# Patient Record
Sex: Male | Born: 1955
Health system: Southern US, Community
[De-identification: ages and names within clinical notes are randomized; demographics above are authoritative.]

## PROBLEM LIST (undated history)

## (undated) DIAGNOSIS — H269 Unspecified cataract: Secondary | ICD-10-CM

## (undated) DIAGNOSIS — N400 Enlarged prostate without lower urinary tract symptoms: Secondary | ICD-10-CM

## (undated) DIAGNOSIS — I10 Essential (primary) hypertension: Secondary | ICD-10-CM

## (undated) DIAGNOSIS — K219 Gastro-esophageal reflux disease without esophagitis: Secondary | ICD-10-CM

## (undated) DIAGNOSIS — F419 Anxiety disorder, unspecified: Secondary | ICD-10-CM

## (undated) HISTORY — DX: Anxiety disorder, unspecified: F41.9

## (undated) HISTORY — DX: Essential (primary) hypertension: I10

## (undated) HISTORY — DX: Benign prostatic hyperplasia without lower urinary tract symptoms: N40.0

## (undated) HISTORY — PX: EYE SURGERY: SHX253

## (undated) HISTORY — DX: Unspecified cataract: H26.9

## (undated) HISTORY — DX: Gastro-esophageal reflux disease without esophagitis: K21.9

## (undated) HISTORY — PX: DOBUTAMINE STRESS ECHO: SHX5426

---

## 2003-12-04 ENCOUNTER — Encounter: Admission: RE | Admit: 2003-12-04 | Discharge: 2003-12-04 | Payer: Self-pay | Admitting: Family Medicine

## 2005-11-03 ENCOUNTER — Ambulatory Visit: Payer: Self-pay | Admitting: Family Medicine

## 2006-03-01 ENCOUNTER — Ambulatory Visit: Payer: Self-pay | Admitting: Family Medicine

## 2006-03-31 ENCOUNTER — Ambulatory Visit: Payer: Self-pay | Admitting: Family Medicine

## 2006-04-22 ENCOUNTER — Ambulatory Visit: Payer: Self-pay | Admitting: Family Medicine

## 2006-04-22 ENCOUNTER — Encounter: Admission: RE | Admit: 2006-04-22 | Discharge: 2006-04-22 | Payer: Self-pay | Admitting: Family Medicine

## 2006-04-27 ENCOUNTER — Encounter: Admission: RE | Admit: 2006-04-27 | Discharge: 2006-04-27 | Payer: Self-pay | Admitting: Family Medicine

## 2007-01-11 ENCOUNTER — Ambulatory Visit: Payer: Self-pay | Admitting: Internal Medicine

## 2007-01-11 DIAGNOSIS — I10 Essential (primary) hypertension: Secondary | ICD-10-CM | POA: Insufficient documentation

## 2007-01-11 DIAGNOSIS — R002 Palpitations: Secondary | ICD-10-CM

## 2007-01-12 LAB — CONVERTED CEMR LAB
ALT: 34 units/L (ref 0–53)
AST: 25 units/L (ref 0–37)
Albumin: 4.3 g/dL (ref 3.5–5.2)
Alkaline Phosphatase: 60 units/L (ref 39–117)
BUN: 11 mg/dL (ref 6–23)
Basophils Absolute: 0 10*3/uL (ref 0.0–0.1)
Basophils Relative: 0.1 % (ref 0.0–1.0)
Bilirubin, Direct: 0.1 mg/dL (ref 0.0–0.3)
CO2: 32 meq/L (ref 19–32)
Calcium: 9.6 mg/dL (ref 8.4–10.5)
Chloride: 102 meq/L (ref 96–112)
Cholesterol: 187 mg/dL (ref 0–200)
Creatinine, Ser: 1 mg/dL (ref 0.4–1.5)
Eosinophils Absolute: 0.1 10*3/uL (ref 0.0–0.6)
Eosinophils Relative: 1.5 % (ref 0.0–5.0)
GFR calc Af Amer: 102 mL/min
GFR calc non Af Amer: 84 mL/min
Glucose, Bld: 103 mg/dL — ABNORMAL HIGH (ref 70–99)
HCT: 46.1 % (ref 39.0–52.0)
HDL: 51 mg/dL (ref >39.0)
Hemoglobin: 15.6 g/dL (ref 13.0–17.0)
LDL Cholesterol: 117 mg/dL — ABNORMAL HIGH (ref 0–99)
Lymphocytes Relative: 22.6 % (ref 12.0–46.0)
MCHC: 33.9 g/dL (ref 30.0–36.0)
MCV: 94.6 fL (ref 78.0–100.0)
Monocytes Absolute: 0.7 10*3/uL (ref 0.2–0.7)
Monocytes Relative: 8.8 % (ref 3.0–11.0)
Neutro Abs: 5.8 10*3/uL (ref 1.4–7.7)
Neutrophils Relative %: 67 % (ref 43.0–77.0)
Phosphorus: 3.6 mg/dL (ref 2.3–4.6)
Platelets: 239 10*3/uL (ref 150–400)
Potassium: 4.3 meq/L (ref 3.5–5.1)
RBC: 4.87 M/uL (ref 4.22–5.81)
RDW: 12.4 % (ref 11.5–14.6)
Sodium: 141 meq/L (ref 135–145)
TSH: 2.01 microintl units/mL (ref 0.35–5.50)
Total Bilirubin: 1.2 mg/dL (ref 0.3–1.2)
Total CHOL/HDL Ratio: 3.7
Total Protein: 7.5 g/dL (ref 6.0–8.3)
Triglycerides: 97 mg/dL (ref 0–149)
VLDL: 19 mg/dL (ref 0–40)
WBC: 8.5 10*3/uL (ref 4.5–10.5)

## 2007-01-26 HISTORY — PX: DOBUTAMINE STRESS ECHO: SHX5426

## 2007-02-02 ENCOUNTER — Encounter: Payer: Self-pay | Admitting: Internal Medicine

## 2007-02-02 ENCOUNTER — Ambulatory Visit: Payer: Self-pay

## 2007-03-16 ENCOUNTER — Ambulatory Visit: Payer: Self-pay | Admitting: Internal Medicine

## 2007-09-18 ENCOUNTER — Ambulatory Visit: Payer: Self-pay | Admitting: Internal Medicine

## 2007-10-20 ENCOUNTER — Ambulatory Visit: Payer: Self-pay | Admitting: Internal Medicine

## 2007-10-20 DIAGNOSIS — K589 Irritable bowel syndrome without diarrhea: Secondary | ICD-10-CM | POA: Insufficient documentation

## 2007-11-20 ENCOUNTER — Ambulatory Visit: Payer: Self-pay | Admitting: Internal Medicine

## 2007-11-22 LAB — CONVERTED CEMR LAB
Albumin: 4 g/dL (ref 3.5–5.2)
BUN: 13 mg/dL (ref 6–23)
Basophils Absolute: 0 10*3/uL (ref 0.0–0.1)
Basophils Relative: 0.5 % (ref 0.0–3.0)
CO2: 32 meq/L (ref 19–32)
Calcium: 9.2 mg/dL (ref 8.4–10.5)
Chloride: 102 meq/L (ref 96–112)
Cholesterol: 184 mg/dL (ref 0–200)
Creatinine, Ser: 1.1 mg/dL (ref 0.4–1.5)
Eosinophils Absolute: 0.2 10*3/uL (ref 0.0–0.7)
Eosinophils Relative: 1.9 % (ref 0.0–5.0)
GFR calc Af Amer: 91 mL/min
GFR calc non Af Amer: 75 mL/min
Glucose, Bld: 109 mg/dL — ABNORMAL HIGH (ref 70–99)
HCT: 44 % (ref 39.0–52.0)
HDL: 49.8 mg/dL (ref >39.0)
Hemoglobin: 15.4 g/dL (ref 13.0–17.0)
LDL Cholesterol: 114 mg/dL — ABNORMAL HIGH (ref 0–99)
Lymphocytes Relative: 25.1 % (ref 12.0–46.0)
MCHC: 35 g/dL (ref 30.0–36.0)
MCV: 93.8 fL (ref 78.0–100.0)
Monocytes Absolute: 0.7 10*3/uL (ref 0.1–1.0)
Monocytes Relative: 8.6 % (ref 3.0–12.0)
Neutro Abs: 5 10*3/uL (ref 1.4–7.7)
Neutrophils Relative %: 63.9 % (ref 43.0–77.0)
PSA: 0.89 ng/mL (ref 0.10–4.00)
Phosphorus: 3.1 mg/dL (ref 2.3–4.6)
Platelets: 205 10*3/uL (ref 150–400)
Potassium: 4.8 meq/L (ref 3.5–5.1)
RBC: 4.69 M/uL (ref 4.22–5.81)
RDW: 12.2 % (ref 11.5–14.6)
Sodium: 140 meq/L (ref 135–145)
TSH: 2.01 microintl units/mL (ref 0.35–5.50)
Total CHOL/HDL Ratio: 3.7
Triglycerides: 101 mg/dL (ref 0–149)
VLDL: 20 mg/dL (ref 0–40)
WBC: 7.8 10*3/uL (ref 4.5–10.5)

## 2008-01-10 ENCOUNTER — Encounter: Payer: Self-pay | Admitting: Internal Medicine

## 2008-02-01 ENCOUNTER — Telehealth: Payer: Self-pay | Admitting: Family Medicine

## 2008-03-01 ENCOUNTER — Encounter: Payer: Self-pay | Admitting: Internal Medicine

## 2008-03-01 ENCOUNTER — Ambulatory Visit: Payer: Self-pay | Admitting: Gastroenterology

## 2008-03-01 HISTORY — PX: COLONOSCOPY: SHX174

## 2008-03-01 LAB — HM COLONOSCOPY

## 2008-05-22 ENCOUNTER — Ambulatory Visit: Payer: Self-pay | Admitting: Internal Medicine

## 2008-05-22 DIAGNOSIS — K219 Gastro-esophageal reflux disease without esophagitis: Secondary | ICD-10-CM | POA: Insufficient documentation

## 2008-12-02 ENCOUNTER — Encounter: Payer: Self-pay | Admitting: Internal Medicine

## 2008-12-03 ENCOUNTER — Ambulatory Visit: Payer: Self-pay | Admitting: Internal Medicine

## 2008-12-03 DIAGNOSIS — G479 Sleep disorder, unspecified: Secondary | ICD-10-CM | POA: Insufficient documentation

## 2008-12-03 DIAGNOSIS — M79609 Pain in unspecified limb: Secondary | ICD-10-CM

## 2008-12-03 DIAGNOSIS — H9190 Unspecified hearing loss, unspecified ear: Secondary | ICD-10-CM | POA: Insufficient documentation

## 2008-12-06 LAB — CONVERTED CEMR LAB
ALT: 30 units/L (ref 0–53)
AST: 26 units/L (ref 0–37)
BUN: 11 mg/dL (ref 6–23)
Bilirubin, Direct: 0 mg/dL (ref 0.0–0.3)
Creatinine, Ser: 1.1 mg/dL (ref 0.4–1.5)
GFR calc non Af Amer: 74.97 mL/min (ref >60)
HCT: 46 % (ref 39.0–52.0)
Lymphocytes Relative: 28.8 % (ref 12.0–46.0)
Monocytes Relative: 8.2 % (ref 3.0–12.0)
Phosphorus: 3 mg/dL (ref 2.3–4.6)
Platelets: 188 10*3/uL (ref 150.0–400.0)
RDW: 12.5 % (ref 11.5–14.6)
Total Protein: 7.4 g/dL (ref 6.0–8.3)

## 2008-12-09 ENCOUNTER — Encounter: Payer: Self-pay | Admitting: Internal Medicine

## 2009-03-06 ENCOUNTER — Telehealth: Payer: Self-pay | Admitting: Internal Medicine

## 2009-10-06 ENCOUNTER — Encounter: Payer: Self-pay | Admitting: Internal Medicine

## 2009-10-07 ENCOUNTER — Ambulatory Visit: Payer: Self-pay | Admitting: Internal Medicine

## 2009-10-07 DIAGNOSIS — M549 Dorsalgia, unspecified: Secondary | ICD-10-CM | POA: Insufficient documentation

## 2009-10-07 DIAGNOSIS — R079 Chest pain, unspecified: Secondary | ICD-10-CM

## 2009-10-08 LAB — CONVERTED CEMR LAB
BUN: 15 mg/dL (ref 6–23)
Basophils Absolute: 0.1 10*3/uL (ref 0.0–0.1)
Bilirubin, Direct: 0.1 mg/dL (ref 0.0–0.3)
Calcium: 9.5 mg/dL (ref 8.4–10.5)
Creatinine, Ser: 1.1 mg/dL (ref 0.4–1.5)
Eosinophils Absolute: 0.1 10*3/uL (ref 0.0–0.7)
Eosinophils Relative: 1.9 % (ref 0.0–5.0)
Glucose, Bld: 101 mg/dL — ABNORMAL HIGH (ref 70–99)
Lymphs Abs: 2 10*3/uL (ref 0.7–4.0)
MCHC: 33.7 g/dL (ref 30.0–36.0)
MCV: 96.7 fL (ref 78.0–100.0)
Monocytes Absolute: 0.6 10*3/uL (ref 0.1–1.0)
Neutrophils Relative %: 56.8 % (ref 43.0–77.0)
PSA: 0.56 ng/mL (ref 0.10–4.00)
Platelets: 204 10*3/uL (ref 150.0–400.0)
RDW: 13.1 % (ref 11.5–14.6)
Sodium: 142 meq/L (ref 135–145)
Total Bilirubin: 0.7 mg/dL (ref 0.3–1.2)
WBC: 6.6 10*3/uL (ref 4.5–10.5)

## 2009-10-28 ENCOUNTER — Telehealth: Payer: Self-pay | Admitting: Internal Medicine

## 2009-11-28 ENCOUNTER — Telehealth: Payer: Self-pay | Admitting: Internal Medicine

## 2010-02-06 ENCOUNTER — Other Ambulatory Visit: Payer: Self-pay | Admitting: Internal Medicine

## 2010-02-06 ENCOUNTER — Ambulatory Visit
Admission: RE | Admit: 2010-02-06 | Discharge: 2010-02-06 | Payer: Self-pay | Source: Home / Self Care | Attending: Internal Medicine | Admitting: Internal Medicine

## 2010-02-06 ENCOUNTER — Encounter: Payer: Self-pay | Admitting: Internal Medicine

## 2010-02-06 DIAGNOSIS — F411 Generalized anxiety disorder: Secondary | ICD-10-CM | POA: Insufficient documentation

## 2010-02-06 LAB — POTASSIUM: Potassium: 5.2 mEq/L — ABNORMAL HIGH (ref 3.5–5.1)

## 2010-02-10 ENCOUNTER — Ambulatory Visit
Admission: RE | Admit: 2010-02-10 | Discharge: 2010-02-10 | Payer: Self-pay | Source: Home / Self Care | Attending: Psychology | Admitting: Psychology

## 2010-02-24 ENCOUNTER — Ambulatory Visit: Admit: 2010-02-24 | Payer: Self-pay | Admitting: Psychology

## 2010-02-24 NOTE — Progress Notes (Signed)
Summary: Rx Trazodone  Phone Note Refill Request Call back at [phone] Message from:  Scriptline on March 06, 2009 8:13 AM  Refills Requested: Medication #1:  TRAZODONE HCL 50 MG TABS 2 tab by mouth at bedtime   Last Refilled: 01/31/2009 Received e-script request, please advise   Method Requested: Telephone to Pharmacy Initial call taken by: Linde Gillis CMA [facility]ull),  March 06, 2009 8:15 AM  Follow-up for Phone Call        okay to refill x 1 year Follow-up by: Cindee Salt MD,  March 06, 2009 5:49 PM  Additional Follow-up for Phone Call Additional follow up Details #1::        Rx faxed to pharmacy Additional Follow-up by: DeShannon Smith CMA [facility]),  March 07, 2009 9:47 AM    Prescriptions: TRAZODONE HCL 50 MG TABS (TRAZODONE HCL) 2 tab by mouth at bedtime  #60 x 12   Entered by:   Mervin Hack CMA (AAMA)   Authorized by:   Cindee Salt MD   Signed by:   Mervin Hack CMA (AAMA) on 03/07/2009   Method used:   Electronically to        [facility] [address] (retail)       [address]       [place], [place]  [address]       Ph: [phone]       Fax: [phone]   RxID:   [id number]

## 2010-02-24 NOTE — Assessment & Plan Note (Signed)
Summary: LOWER BACK PAIN/RBH   Vital Signs:  Patient profile:   55 year old male Weight:      190 pounds BMI:     26.60 Temp:     98.4 degrees F oral Pulse rate:   60 / minute Pulse rhythm:   regular BP sitting:   110 / 60  (left arm) Cuff size:   large  Vitals Entered By: Mervin Hack CMA [facility] Dull) (October 07, 2009 9:52 AM) CC: BACK PAIN   History of Present Illness: Has several concerns  Noticed in winter time that when lifting and bringing thikngs close to his body, he will get a sharp pain down in the left groin and down medial leg. No bulging. Now only has occ tingling but mostly gone. he is careful to lift using his legs No leg weakness  Still gets occ pain in left chest woke him up last night No SOB No heartburn during the day or with the episode Has been more careful with his diet  GERD generally controlled with the omeprazole  Having back pain doesn't recall any injury Gets pain in upper left back if he works above his head occ pain in "kidney area" Gets "drawing" with deep breath Has pain with prolonged standing intermittent Has tried some stretches  Awakens fine---then may come on as day goes on Plays softball in evening and no problems with that Has tried some aleve---it eliminates the pain  feels well now  Allergies: No Known Drug Allergies  Past History:  Past medical, surgical, family and social histories (including risk factors) reviewed for relevance to current acute and chronic problems.  Past Medical History: Reviewed history from 05/22/2008 and no changes required. Hypertension Anxiety GERD  Past Surgical History: Stress echo negative 12/09  Family History: Reviewed history from 01/11/2007 and no changes required. Dad died of COPD, enlarged heart @65  Mom okay 1 brother with emphysema 1 sister with arthrits No CAD, DM, HTN No colon or prostate cancer  Social History: Reviewed history from 11/20/2007 and no changes  required. Occupation: Airline pilot for doors and hardware Divorced---4 children Former smoker--1990---occ chews Alcohol use-no  Review of Systems       no dysuria or hematuria weight down 12#--he is surprised by this but has been more careful with cutting out sugared drinks still doesn't eat regular meals  Physical Exam  General:  alert and normal appearance.   Neck:  supple, no masses, no thyromegaly, no carotid bruits, and no cervical lymphadenopathy.   Lungs:  normal respiratory effort, no intercostal retractions, no accessory muscle use, and normal breath sounds.   Heart:  normal rate, regular rhythm, no murmur, and no gallop.   Abdomen:  soft, non-tender, and no inguinal hernia.   Msk:  No back tenderness Normal ROM Neurologic:  strength normal in all extremities and gait normal.   Psych:  normally interactive, good eye contact, not anxious appearing, and not depressed appearing.     Impression & Recommendations:  Problem # 1:  CHEST PAIN (ICD-786.50) Assessment New  doesn't sound ischemic seems to have adequate GERD control will just observe will check labs due to concern about weight loss  Orders: Venipuncture (54098) TLB-Renal Function Panel (80069-RENAL) TLB-CBC Platelet - w/Differential (85025-CBCD) TLB-Hepatic/Liver Function Pnl (80076-HEPATIC) TLB-TSH (Thyroid Stimulating Hormone) (84443-TSH) EKG w/ Interpretation (93000)  Problem # 2:  BACK PAIN (ICD-724.5) Assessment: New lower thoracic clearly seems to be mechanical muscular pain can use NSAIDs needs to evaluate work mechanics--does have to hold things above  his head often  Complete Medication List: 1)  Trazodone Hcl 50 Mg Tabs (Trazodone hcl) .... 2 tab by mouth at bedtime 2)  Multivitamins Tabs (Multiple vitamin) .... Take 1 tablet by mouth once a day 3)  Omeprazole 20 Mg Cpdr (Omeprazole) .... Take 1 by mouth once daily  Other Orders: TLB-PSA (Prostate Specific Antigen) (95621-HYQ)  Patient  Instructions: 1)  Keep physical appt in January  Current Allergies (reviewed today): No known allergies    EKG  Procedure date:  10/07/2009  Findings:      Sinus bradycardia @ 49 Normal

## 2010-02-24 NOTE — Progress Notes (Signed)
Summary: needs trazodone script changed  Phone Note Refill Request Call back at Work Phone [phone] Message from:  Patient  Refills Requested: Medication #1:  TRAZODONE HCL 100 MG TABS 1 tab at bedtime to help sleep. Pt increased to 150 mg's at bedtime so he  needs increased amount.  He has refills but we need to call pharmacy and change instructions.  [facility] [address].  Initial call taken by: Lowella Petties CMA, AAMA,  November 28, 2009 9:41 AM  Follow-up for Phone Call        okay to change to 150mg  at bedtime send #30 x 11 Follow-up by: Cindee Salt MD,  November 28, 2009 2:17 PM  Additional Follow-up for Phone Call Additional follow up Details #1::        Rx send to pharmacy.  Message left on voicemail at patients job advising as instructed. Additional Follow-up by: Linde Gillis CMA [facility]ull),  November 28, 2009 2:31 PM    New/Updated Medications: TRAZODONE HCL 150 MG TABS (TRAZODONE HCL) take one tablet by mouth at bedtime Prescriptions: TRAZODONE HCL 150 MG TABS (TRAZODONE HCL) take one tablet by mouth at bedtime  #30 x 11   Entered by:   Linde Gillis CMA (AAMA)   Authorized by:   Cindee Salt MD   Signed by:   Linde Gillis CMA (AAMA) on 11/28/2009   Method used:   Electronically to        [facility] [address] (retail)       [address], [address]       [place], [place]  [address]       Ph: [phone]       Fax: [phone]   RxID:   [id number]   Current Allergies (reviewed today): No known allergies

## 2010-02-24 NOTE — Progress Notes (Signed)
Summary: regarding trazodone  Phone Note From Pharmacy   Caller: [facility] [address] Summary of Call: Pharmacy is asking to change trazadone to 100 mg's, one at bedtime.  50 mg tablets are on backorder.  Form is on your desk. Initial call taken by: Lowella Petties CMA,  October 28, 2009 11:42 AM  Follow-up for Phone Call        Prescription resent and changed to the 100mg  Please let the patient know about the change Follow-up by: Cindee Salt MD,  October 28, 2009 1:48 PM    New/Updated Medications: TRAZODONE HCL 100 MG TABS (TRAZODONE HCL) 1 tab at bedtime to help sleep Prescriptions: TRAZODONE HCL 100 MG TABS (TRAZODONE HCL) 1 tab at bedtime to help sleep  #30 x 11   Entered and Authorized by:   Cindee Salt MD   Signed by:   Cindee Salt MD on 10/28/2009   Method used:   Electronically to        [facility] [address] (retail)       [address], [address]       [place], [place]  [address]       Ph: [phone]       Fax: [phone]   RxID:   [id number]

## 2010-02-26 NOTE — Assessment & Plan Note (Signed)
Summary: CPX/RBH   Vital Signs:  Patient profile:   55 year old male Weight:      194 pounds Temp:     98.1 degrees F oral Pulse rate:   60 / minute Pulse rhythm:   regular BP sitting:   133 / 87  (left arm) Cuff size:   large  Vitals Entered By: Mervin Hack CMA (AAMA) (February 06, 2010 8:30 AM) CC: adult physical   History of Present Illness: Doing okay back is better  Ongoing occ sleep and anxiety issues Did go up on trazodone Sleeping okay No major blow-ups at work Girlfriend (finacee)  concerned about his behaviors still--would like him to see counsellor When she has trust issues, etc--this adds to his anxiety   Allergies: No Known Drug Allergies  Past History:  Past medical, surgical, family and social histories (including risk factors) reviewed for relevance to current acute and chronic problems.  Past Medical History: Reviewed history from 05/22/2008 and no changes required. Hypertension Anxiety GERD  Past Surgical History: Reviewed history from 10/07/2009 and no changes required. Stress echo negative 12/09  Family History: Reviewed history from 01/11/2007 and no changes required. Dad died of COPD, enlarged heart @65  Mom okay 1 brother with emphysema 1 sister with arthrits No CAD, DM, HTN No colon or prostate cancer  Social History: Reviewed history from 11/20/2007 and no changes required. 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 Eyes:  Denies double vision and vision loss-1 eye. ENT:  Complains of ringing in ears; denies decreased hearing; chronic tiinnitus Teeth okay--overdue for dentist. CV:  Complains of palpitations; denies chest pain or discomfort, difficulty breathing at night, difficulty breathing while lying down, fainting, lightheadness, and shortness of  breath with exertion; occ palps with nerves. Resp:  Denies cough and shortness of breath. GI:  Complains of indigestion; denies abdominal pain, bloody stools, change in bowel habits, dark tarry stools, nausea, and vomiting; occ heartburn--uses OTC prevacid as needed . GU:  Denies erectile dysfunction, urinary frequency, and urinary hesitancy. MS:  Complains of joint pain; denies joint swelling; right 2nd DIP nodule and starting on left discussed padding when using tools, etc ---tylenol as needed  Right foot arch pain at times. Derm:  Complains of lesion(s); denies rash; stable mole on left lower calf. Neuro:  Complains of headaches and numbness; denies tingling and weakness; occ hand numbness--fine if he moves it around Occ stress headache--no migraines. Psych:  Complains of anxiety and depression; ongoing anxiety Mild depression--feels the trazodone helps some Business is still struggling. Heme:  Denies abnormal bruising and enlarge lymph nodes. Allergy:  Complains of seasonal allergies and sneezing; mild seasonal symptoms---occ OTC meds.  Physical Exam  General:  alert and normal appearance.   Eyes:  pupils equal, pupils round, pupils reactive to light, and no optic disk abnormalities.   Ears:  R ear normal and L ear normal.   Mouth:  no erythema, no exudates, and no lesions.   Neck:  supple, no masses, no carotid bruits, and no cervical lymphadenopathy.   Lungs:  normal respiratory effort, no intercostal retractions, no accessory muscle use, and normal breath sounds.   Heart:  normal rate, regular rhythm, no murmur, and no gallop.   Abdomen:  soft, normal bowel sounds, and no masses.   Rectal:  deferred  after discussion Msk:  no joint tenderness and  no joint swelling.   Early DIP nodule on left 2nd finger Extremities:  no edema Neurologic:  alert & oriented X3, strength normal in all extremities, and gait normal.   Skin:  no rashes and no suspicious lesions.   Benign nevus on left  calf Axillary Nodes:  No palpable lymphadenopathy Psych:  normally interactive, good eye contact, not depressed appearing, and slightly anxious.     Impression & Recommendations:  Problem # 1:  PREVENTIVE HEALTH CARE (ICD-V70.0) Assessment Comment Only healthy discussed fitness had colon and PSA  Problem # 2:  ANXIETY (ICD-300.00) Assessment: Deteriorated  still some ongoing issues will set up counselling  His updated medication list for this problem includes:    Trazodone Hcl 150 Mg Tabs (Trazodone hcl) .Marland Kitchen... Take one tablet by mouth at bedtime  Orders: Psychology Referral (Psychology)  Complete Medication List: 1)  Multivitamins Tabs (Multiple vitamin) .... Take 1 tablet by mouth once a day 2)  Omeprazole 20 Mg Cpdr (Omeprazole) .... Take 1 by mouth once daily 3)  Trazodone Hcl 150 Mg Tabs (Trazodone hcl) .... Take one tablet by mouth at bedtime  Other Orders: TLB-Potassium (K+) (84132-K) Venipuncture ([address])  Patient Instructions: 1)  Please schedule a follow-up appointment in 6 months .  2)  Referral Appointment Information 3)  Day/Date: 4)  Time: 5)  Place/MD: 6)  Address: 7)  Phone/Fax: 8)  Patient given appointment information. Information/Orders faxed/mailed.    Orders Added: 1)  TLB-Potassium (K+) [84132-K] 2)  Venipuncture [60454] 3)  Est. Patient 40-64 years [99396] 4)  Psychology Referral [Psychology]    Current Allergies (reviewed today): No known allergies

## 2010-02-26 NOTE — Miscellaneous (Signed)
Summary: LANSOPRAZOLE  Clinical Lists Changes  Medications: Added new medication of LANSOPRAZOLE 30 MG CPDR (LANSOPRAZOLE) take 1 by mouth once daily as needed for reflux - Signed Removed medication of OMEPRAZOLE 20 MG CPDR (OMEPRAZOLE) take 1 by mouth once daily Rx of LANSOPRAZOLE 30 MG CPDR (LANSOPRAZOLE) take 1 by mouth once daily as needed for reflux;  #90 x 3;  Signed;  Entered by: Mervin Hack CMA (AAMA);  Authorized by: Cindee Salt MD;  Method used: Electronically to [facility]  [phone], [place], [place], [place]  [address], Ph: [phone], Fax: [phone]    Prescriptions: LANSOPRAZOLE 30 MG CPDR (LANSOPRAZOLE) take 1 by mouth once daily as needed for reflux  #90 x 3   Entered by:   Mervin Hack CMA (AAMA)   Authorized by:   Cindee Salt MD   Signed by:   Mervin Hack CMA (AAMA) on 02/06/2010   Method used:   Electronically to        [facility] [address] (retail)       [address], [address]       [place], [place]  [address]       Ph: [phone]       Fax: [phone]   RxID:   [id number]

## 2010-03-11 ENCOUNTER — Ambulatory Visit (INDEPENDENT_AMBULATORY_CARE_PROVIDER_SITE_OTHER): Payer: BC Managed Care – PPO | Admitting: Psychology

## 2010-03-11 DIAGNOSIS — F4323 Adjustment disorder with mixed anxiety and depressed mood: Secondary | ICD-10-CM

## 2010-03-25 ENCOUNTER — Ambulatory Visit: Payer: BC Managed Care – PPO | Admitting: Psychology

## 2010-08-11 ENCOUNTER — Ambulatory Visit: Payer: Self-pay | Admitting: Internal Medicine

## 2010-09-21 ENCOUNTER — Ambulatory Visit (INDEPENDENT_AMBULATORY_CARE_PROVIDER_SITE_OTHER): Payer: BC Managed Care – PPO | Admitting: Internal Medicine

## 2010-09-21 ENCOUNTER — Encounter: Payer: Self-pay | Admitting: Internal Medicine

## 2010-09-21 DIAGNOSIS — I10 Essential (primary) hypertension: Secondary | ICD-10-CM

## 2010-09-21 DIAGNOSIS — G479 Sleep disorder, unspecified: Secondary | ICD-10-CM

## 2010-09-21 DIAGNOSIS — F411 Generalized anxiety disorder: Secondary | ICD-10-CM

## 2010-09-21 DIAGNOSIS — K219 Gastro-esophageal reflux disease without esophagitis: Secondary | ICD-10-CM

## 2010-09-21 NOTE — Assessment & Plan Note (Signed)
Okay on the trazodone

## 2010-09-21 NOTE — Assessment & Plan Note (Signed)
Doing well Discussed weaning the PPI to every other day if possible

## 2010-09-21 NOTE — Assessment & Plan Note (Signed)
Ongoing stress with work Dealing with girlfriend better---she tries to draw him out and that will spark his anger at times

## 2010-09-21 NOTE — Progress Notes (Signed)
  Subjective:    Patient ID: Gabriel Sullivan, male    DOB: [birth year], 55 y.o.   MRN: [id number]  HPI Doing well Business still not that good Ongoing stress--mostly work Did see Dr Laymond Purser a few visits (with girlfriend) Relationship seems to be better He still has triggers for his anger; working with girlfriend to not "push buttons"  Sleeping fine Uses the trazodone  No chest pain No SOB No sig edema  Still on prevacid occ skips doses---as much as 3 days and then will get his symptoms  No current outpatient prescriptions on file prior to visit.    No Known Allergies  Past Medical History  Diagnosis Date  . Hypertension   . GERD (gastroesophageal reflux disease)   . Anxiety     Past Surgical History  Procedure Date  . Dobutamine stress echo     negative    Family History  Problem Relation Age of Onset  . Arthritis Sister   . Diabetes Neg Hx   . Hypertension Neg Hx   . Heart disease Neg Hx   . Cancer Neg Hx     History   Social History  . Marital Status: Married    Spouse Name: N/A    Number of Children: 4  . Years of Education: N/A   Occupational History  . sales for doors and hardware    Social History Main Topics  . Smoking status: Former Smoker    Types: Cigarettes  . Smokeless tobacco: Current User    Types: Chew  . Alcohol Use: No  . Drug Use: No  . Sexually Active: Not on file   Other Topics Concern  . Not on file   Social History Narrative  . No narrative on file   Review of Systems Appetite is fine Weight is stable Back to playing softball     Objective:   Physical Exam  Constitutional: He appears well-developed and well-nourished. No distress.  Neck: Normal range of motion. Neck supple.  Cardiovascular: Normal rate, regular rhythm and normal heart sounds.  Exam reveals no gallop.   No murmur heard. Pulmonary/Chest: Effort normal and breath sounds normal. No respiratory distress. He has no wheezes. He has no rales.  Abdominal:  Soft. There is no tenderness.  Musculoskeletal: Normal range of motion. He exhibits no edema and no tenderness.  Lymphadenopathy:    He has no cervical adenopathy.  Psychiatric: He has a normal mood and affect. His behavior is normal. Judgment and thought content normal.          Assessment & Plan:

## 2010-09-21 NOTE — Assessment & Plan Note (Signed)
BP Readings from Last 3 Encounters:  09/21/10 139/72  02/06/10 133/87  10/07/09 110/60   Generally okay No changes needed

## 2010-12-01 ENCOUNTER — Ambulatory Visit (INDEPENDENT_AMBULATORY_CARE_PROVIDER_SITE_OTHER): Payer: BC Managed Care – PPO

## 2010-12-01 DIAGNOSIS — Z23 Encounter for immunization: Secondary | ICD-10-CM

## 2010-12-08 ENCOUNTER — Ambulatory Visit: Payer: BC Managed Care – PPO

## 2011-01-04 ENCOUNTER — Other Ambulatory Visit: Payer: Self-pay | Admitting: Internal Medicine

## 2011-02-08 ENCOUNTER — Other Ambulatory Visit: Payer: Self-pay | Admitting: Internal Medicine

## 2011-03-29 ENCOUNTER — Encounter: Payer: Self-pay | Admitting: Internal Medicine

## 2011-03-29 ENCOUNTER — Ambulatory Visit (INDEPENDENT_AMBULATORY_CARE_PROVIDER_SITE_OTHER): Payer: BC Managed Care – PPO | Admitting: Internal Medicine

## 2011-03-29 VITALS — BP 130/80 | HR 62 | Temp 98.4°F | Ht 71.0 in | Wt 201.0 lb

## 2011-03-29 DIAGNOSIS — F411 Generalized anxiety disorder: Secondary | ICD-10-CM

## 2011-03-29 DIAGNOSIS — I10 Essential (primary) hypertension: Secondary | ICD-10-CM

## 2011-03-29 DIAGNOSIS — Z0000 Encounter for general adult medical examination without abnormal findings: Secondary | ICD-10-CM | POA: Insufficient documentation

## 2011-03-29 DIAGNOSIS — K219 Gastro-esophageal reflux disease without esophagitis: Secondary | ICD-10-CM

## 2011-03-29 DIAGNOSIS — R7301 Impaired fasting glucose: Secondary | ICD-10-CM | POA: Insufficient documentation

## 2011-03-29 LAB — CBC WITH DIFFERENTIAL/PLATELET
Eosinophils Absolute: 0.2 10*3/uL (ref 0.0–0.7)
Eosinophils Relative: 2 % (ref 0.0–5.0)
HCT: 45.7 % (ref 39.0–52.0)
Lymphs Abs: 1.7 10*3/uL (ref 0.7–4.0)
MCHC: 33.2 g/dL (ref 30.0–36.0)
MCV: 96.3 fl (ref 78.0–100.0)
Monocytes Absolute: 0.7 10*3/uL (ref 0.1–1.0)
Neutrophils Relative %: 67.3 % (ref 43.0–77.0)
Platelets: 215 10*3/uL (ref 150.0–400.0)
WBC: 8.2 10*3/uL (ref 4.5–10.5)

## 2011-03-29 LAB — LIPID PANEL
Cholesterol: 191 mg/dL (ref 0–200)
LDL Cholesterol: 118 mg/dL — ABNORMAL HIGH (ref 0–99)
Triglycerides: 70 mg/dL (ref 0.0–149.0)

## 2011-03-29 LAB — TSH: TSH: 1.9 u[IU]/mL (ref 0.35–5.50)

## 2011-03-29 LAB — BASIC METABOLIC PANEL
BUN: 15 mg/dL (ref 6–23)
CO2: 31 mEq/L (ref 19–32)
Chloride: 102 mEq/L (ref 96–112)
Creatinine, Ser: 1 mg/dL (ref 0.4–1.5)
Glucose, Bld: 94 mg/dL (ref 70–99)
Potassium: 4.9 mEq/L (ref 3.5–5.1)

## 2011-03-29 LAB — HEMOGLOBIN A1C: Hgb A1c MFr Bld: 5.5 % (ref 4.6–6.5)

## 2011-03-29 LAB — HEPATIC FUNCTION PANEL
ALT: 22 U/L (ref 0–53)
AST: 20 U/L (ref 0–37)
Albumin: 4.1 g/dL (ref 3.5–5.2)

## 2011-03-29 MED ORDER — LANSOPRAZOLE 30 MG PO CPDR: 30.0000 mg | DELAYED_RELEASE_CAPSULE | Freq: Every day | ORAL | Status: DC

## 2011-03-29 NOTE — Progress Notes (Signed)
Subjective:    Patient ID: Gabriel Sullivan, male    DOB: 1956-01-16, 56 y.o.   MRN: [id number]  HPI Doing okay Did quit smokeless tobacco about a month ago  Notices some dry mouth and vision problems at times Discussed that this could be from the trazodone Does sleep okay from 11-4:30 with it Still lots on his mind  Things are okay with girlfriend  Still on prevacid Had run out and needed a lot of rolaids Refilled today  Current Outpatient Prescriptions on File Prior to Visit  Medication Sig Dispense Refill  . Multiple Vitamin (MULTIVITAMIN PO) Take 1 tablet by mouth daily.        . traZODone (DESYREL) 150 MG tablet TAKE ONE TABLET BY MOUTH AT BEDTIME  30 tablet  1    No Known Allergies  Past Medical History  Diagnosis Date  . Hypertension   . GERD (gastroesophageal reflux disease)   . Anxiety     Past Surgical History  Procedure Date  . Dobutamine stress echo     negative    Family History  Problem Relation Age of Onset  . Arthritis Sister   . Diabetes Neg Hx   . Hypertension Neg Hx   . Heart disease Neg Hx   . Cancer Neg Hx     History   Social History  . Marital Status: Married    Spouse Name: N/A    Number of Children: 4  . Years of Education: N/A   Occupational History  . sales for doors and hardware    Social History Main Topics  . Smoking status: Former Smoker    Types: Cigarettes  . Smokeless tobacco: Former Neurosurgeon    Types: Chew    Quit date: 03/01/2011  . Alcohol Use: No  . Drug Use: No  . Sexually Active: Not on file   Other Topics Concern  . Not on file   Social History Narrative  . No narrative on file   Review of Systems  Constitutional: Positive for unexpected weight change. Negative for fatigue.       Weight up 7# Wears seat belt  HENT: Positive for congestion, rhinorrhea and tinnitus. Negative for hearing loss and dental problem.        Mild seasonal allergies---doesn't take meds Keeps up with dentist  Eyes: Negative for  visual disturbance.       No diplopia or unilateral vision loss  Respiratory: Negative for cough, chest tightness and shortness of breath.   Cardiovascular: Positive for palpitations. Negative for chest pain.       Occ palpitations with anger and nerves acting up  Gastrointestinal: Negative for nausea, vomiting, abdominal pain, constipation and blood in stool.       Heartburn controlled on meds  Genitourinary: Negative for dysuria, urgency, frequency and difficulty urinating.       Some difficulty maintaining erection  Musculoskeletal: Positive for arthralgias. Negative for back pain and joint swelling.       Wrist and joint aches at times  Skin: Negative for rash.       No suspicious lesions  Neurological: Negative for dizziness, syncope, weakness, light-headedness, numbness and headaches.       Feet feel cold  Hematological: Negative for adenopathy. Does not bruise/bleed easily.  Psychiatric/Behavioral: Positive for sleep disturbance and dysphoric mood. The patient is nervous/anxious.        Some feelings of depression--relates to work stresses Not really anhedonic No thoughts of death or suicide  Objective:   Physical Exam  Constitutional: He is oriented to person, place, and time. He appears well-developed and well-nourished. No distress.  HENT:  Head: Normocephalic and atraumatic.  Right Ear: External ear normal.  Left Ear: External ear normal.  Mouth/Throat: Oropharynx is clear and moist. No oropharyngeal exudate.  Eyes: Conjunctivae and EOM are normal. Pupils are equal, round, and reactive to light.  Neck: Normal range of motion. Neck supple. No thyromegaly present.  Cardiovascular: Normal rate, regular rhythm, normal heart sounds and intact distal pulses.  Exam reveals no gallop.   No murmur heard. Pulmonary/Chest: Effort normal and breath sounds normal. No respiratory distress. He has no wheezes. He has no rales.  Abdominal: Soft. There is no tenderness.   Musculoskeletal: He exhibits no edema and no tenderness.  Lymphadenopathy:    He has no cervical adenopathy.  Neurological: He is alert and oriented to person, place, and time.  Skin: No rash noted. No erythema.       4mm brown macule on left inner calf---just above ankle. No change in past year  Psychiatric: He has a normal mood and affect. His behavior is normal. Judgment and thought content normal.          Assessment & Plan:

## 2011-03-29 NOTE — Assessment & Plan Note (Signed)
BP Readings from Last 3 Encounters:  03/29/11 130/80  09/21/10 139/72  02/06/10 133/87   Good control No changes needed

## 2011-03-29 NOTE — Assessment & Plan Note (Signed)
Generally healthy Discussed fitness Will check PSA

## 2011-03-29 NOTE — Assessment & Plan Note (Signed)
Does okay if he is taking the med

## 2011-03-29 NOTE — Assessment & Plan Note (Signed)
Mostly stress due to business problems still Sleeps fair with the trazodone

## 2011-04-05 ENCOUNTER — Telehealth: Payer: Self-pay | Admitting: *Deleted

## 2011-04-05 NOTE — Telephone Encounter (Signed)
Copy of most recent lab results faxed to patient's office per his request.

## 2011-04-14 ENCOUNTER — Other Ambulatory Visit: Payer: Self-pay | Admitting: Internal Medicine

## 2011-05-15 ENCOUNTER — Other Ambulatory Visit: Payer: Self-pay | Admitting: Internal Medicine

## 2011-07-20 ENCOUNTER — Other Ambulatory Visit: Payer: Self-pay | Admitting: Internal Medicine

## 2011-09-23 ENCOUNTER — Other Ambulatory Visit: Payer: Self-pay | Admitting: Internal Medicine

## 2011-09-29 ENCOUNTER — Ambulatory Visit: Payer: BC Managed Care – PPO | Admitting: Internal Medicine

## 2011-12-13 ENCOUNTER — Telehealth: Payer: Self-pay

## 2011-12-13 MED ORDER — LANSOPRAZOLE 30 MG PO CPDR: 30.0000 mg | DELAYED_RELEASE_CAPSULE | Freq: Every day | ORAL | Status: DC

## 2011-12-13 NOTE — Telephone Encounter (Signed)
Spoke with patient and he stated [facility] changed vendors and that's why the rx went up, per patient he requests that I send rx into [facility] to see what it would cost there. rx sent to pharmacy by e-script

## 2011-12-13 NOTE — Telephone Encounter (Signed)
Pt went to get refill generic Prevacid; co pay went from $ 30 to $120. Can a different med less expensive be sent to [facility] [address] Please advise.

## 2011-12-13 NOTE — Telephone Encounter (Signed)
Please check to see if he has ever been on omeprazole. If no history of treatment failure, change him to omeprazole 20mg  daily 1 year Rx If that is expensive also, he can get it OTC for under $20 per month ([facility], [facility], etc)

## 2011-12-31 ENCOUNTER — Encounter: Payer: Self-pay | Admitting: Internal Medicine

## 2011-12-31 ENCOUNTER — Ambulatory Visit (INDEPENDENT_AMBULATORY_CARE_PROVIDER_SITE_OTHER): Payer: BC Managed Care – PPO | Admitting: Internal Medicine

## 2011-12-31 VITALS — BP 120/80 | HR 60 | Temp 97.9°F | Wt 198.0 lb

## 2011-12-31 DIAGNOSIS — Z23 Encounter for immunization: Secondary | ICD-10-CM

## 2011-12-31 DIAGNOSIS — I10 Essential (primary) hypertension: Secondary | ICD-10-CM

## 2011-12-31 DIAGNOSIS — F411 Generalized anxiety disorder: Secondary | ICD-10-CM

## 2011-12-31 DIAGNOSIS — K219 Gastro-esophageal reflux disease without esophagitis: Secondary | ICD-10-CM

## 2011-12-31 NOTE — Assessment & Plan Note (Signed)
BP Readings from Last 3 Encounters:  12/31/11 120/80  03/29/11 130/80  09/21/10 139/72   Still fine without meds

## 2011-12-31 NOTE — Assessment & Plan Note (Signed)
Has done well despite severe life stress Able to sleep okay now without meds No depression

## 2011-12-31 NOTE — Assessment & Plan Note (Signed)
Does fine with the meds

## 2011-12-31 NOTE — Progress Notes (Signed)
  Subjective:    Patient ID: Gabriel Sullivan, male    DOB: 1955-12-22, 56 y.o.   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 lansoprazole (PREVACID) 30 MG capsule Take 1 capsule (30 mg total) by mouth daily.  90 capsule  3  . Multiple Vitamin (MULTIVITAMIN PO) Take 1 tablet by mouth daily.          No Known Allergies  Past Medical History  Diagnosis Date  . Hypertension   . GERD (gastroesophageal reflux disease)   . Anxiety     Past Surgical History  Procedure Date  . Dobutamine stress echo     negative    Family History  Problem Relation Age of Onset  . Arthritis Sister   . Diabetes Neg Hx   . Hypertension Neg Hx   . Heart disease Neg Hx   . Cancer Neg Hx     History   Social History  . Marital Status: Married    Spouse Name: N/A    Number of Children: 4  . Years of Education: N/A   Occupational History  . 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 Smoking status: Former Smoker    Types: Cigarettes  . Smokeless tobacco: Former Neurosurgeon    Types: Chew    Quit date: 03/01/2011  . Alcohol Use: No  . Drug Use: No  . Sexually Active: Not on file   Other Topics Concern  . Not on file   Social History Narrative  . No narrative on file   Review of Systems Appetite  is fine Weight stable Has some ongoing knee pains---especially bad after kneeling on them. Discussed quad strengthening and NSAIDs prn    Objective:   Physical Exam  Constitutional: He appears well-developed and well-nourished. No distress.  Neck: Normal range of motion. Neck supple. No thyromegaly present.  Cardiovascular: Normal rate, regular rhythm and normal heart sounds.  Exam reveals no gallop.   No murmur heard. Pulmonary/Chest: Effort normal and breath sounds normal. No respiratory distress. He has no wheezes. He has no rales.  Abdominal: Soft. There is no tenderness.  Musculoskeletal: He exhibits no edema and no tenderness.  Lymphadenopathy:    He has no cervical adenopathy.  Psychiatric: He has a normal mood and affect. His behavior is normal.          Assessment & Plan:

## 2012-05-24 ENCOUNTER — Ambulatory Visit (INDEPENDENT_AMBULATORY_CARE_PROVIDER_SITE_OTHER): Payer: BC Managed Care – PPO | Admitting: Internal Medicine

## 2012-05-24 ENCOUNTER — Encounter: Payer: Self-pay | Admitting: Internal Medicine

## 2012-05-24 VITALS — BP 120/80 | HR 61 | Temp 97.7°F | Wt 210.0 lb

## 2012-05-24 DIAGNOSIS — F411 Generalized anxiety disorder: Secondary | ICD-10-CM

## 2012-05-24 DIAGNOSIS — N529 Male erectile dysfunction, unspecified: Secondary | ICD-10-CM | POA: Insufficient documentation

## 2012-05-24 DIAGNOSIS — G479 Sleep disorder, unspecified: Secondary | ICD-10-CM

## 2012-05-24 MED ORDER — TADALAFIL 20 MG PO TABS: 10.0000 mg | ORAL_TABLET | ORAL | Status: DC | PRN

## 2012-05-24 NOTE — Assessment & Plan Note (Addendum)
Mild and mostly reactive because business is still slow It doesn't appear that meds are needed for this now Needs to get back to his exercise

## 2012-05-24 NOTE — Patient Instructions (Addendum)
Please get back to your daily exercise

## 2012-05-24 NOTE — Assessment & Plan Note (Signed)
Doing better back on the trazodone Will continue the 75mg  daily

## 2012-05-24 NOTE — Progress Notes (Signed)
  Subjective:    Patient ID: Gabriel Sullivan, male    DOB: Aug 14, 1955, 57 y.o.   MRN: [id number]  HPI Has started losing sleep again Restarted trazodone--- taking 1/2 of the 150mg  tab This has helped sleep  Feels anxious and depressed too Gets nervous when he is trying to do things---work and personal life Probably 2-3 days per week May get antsy and jittery--able to take deep breaths and settle down No impact on work performance May be snappy with girlfriend and kids  Still depressed about work----business is very slow Not sad or anhedonic though  Anxiety has affected his sex life Gets erection but unable to maintain or achieve orgasm (though can penetrate)  Current Outpatient Prescriptions on File Prior to Visit  Medication Sig Dispense Refill  . lansoprazole (PREVACID) 30 MG capsule Take 1 capsule (30 mg total) by mouth daily.  90 capsule  3  . Multiple Vitamin (MULTIVITAMIN PO) Take 1 tablet by mouth daily.         No current facility-administered medications on file prior to visit.    No Known Allergies  Past Medical History  Diagnosis Date  . Hypertension   . GERD (gastroesophageal reflux disease)   . Anxiety     Past Surgical History  Procedure Laterality Date  . Dobutamine stress echo      negative    Family History  Problem Relation Age of Onset  . Arthritis Sister   . Diabetes Neg Hx   . Hypertension Neg Hx   . Heart disease Neg Hx   . Cancer Neg Hx     History   Social History  . Marital Status: Married    Spouse Name: N/A    Number of Children: 4  . Years of Education: N/A   Occupational History  . 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 Smoking status: Former Smoker    Types: Cigarettes  . Smokeless tobacco: Former Neurosurgeon    Types: Chew    Quit date: 03/01/2011  . Alcohol Use: No  . Drug Use: No  . Sexually Active: Not on file   Other Topics Concern  . Not on file    Social History Narrative  . No narrative on file   Review of Systems Appetite is so-so Has gained 12# since December Not really exercising---had been walking regularly    Objective:   Physical Exam  Constitutional: He appears well-developed and well-nourished. No distress.  Psychiatric: He has a normal mood and affect. His behavior is normal.          Assessment & Plan:

## 2012-05-24 NOTE — Assessment & Plan Note (Signed)
Will try cialis

## 2012-06-28 ENCOUNTER — Other Ambulatory Visit: Payer: Self-pay | Admitting: Internal Medicine

## 2012-06-29 NOTE — Telephone Encounter (Signed)
rx sent to pharmacy by e-script  

## 2012-06-29 NOTE — Telephone Encounter (Signed)
Okay to refill for a year 

## 2012-07-25 ENCOUNTER — Ambulatory Visit (INDEPENDENT_AMBULATORY_CARE_PROVIDER_SITE_OTHER): Payer: BC Managed Care – PPO | Admitting: Internal Medicine

## 2012-07-25 ENCOUNTER — Encounter: Payer: Self-pay | Admitting: Internal Medicine

## 2012-07-25 VITALS — BP 120/80 | HR 74 | Temp 98.0°F | Ht 71.0 in | Wt 209.0 lb

## 2012-07-25 DIAGNOSIS — K219 Gastro-esophageal reflux disease without esophagitis: Secondary | ICD-10-CM

## 2012-07-25 DIAGNOSIS — N400 Enlarged prostate without lower urinary tract symptoms: Secondary | ICD-10-CM | POA: Insufficient documentation

## 2012-07-25 DIAGNOSIS — G479 Sleep disorder, unspecified: Secondary | ICD-10-CM

## 2012-07-25 DIAGNOSIS — Z0000 Encounter for general adult medical examination without abnormal findings: Secondary | ICD-10-CM

## 2012-07-25 MED ORDER — TADALAFIL 5 MG PO TABS
5.0000 mg | ORAL_TABLET | Freq: Every day | ORAL | Status: DC
Start: 2012-07-25 — End: 2012-11-28

## 2012-07-25 NOTE — Assessment & Plan Note (Signed)
Fairly healthy Starting to work out Will defer PSA to next year UTD on colon

## 2012-07-25 NOTE — Assessment & Plan Note (Signed)
Mild symptoms Will try the every day cialis to help with this and ED

## 2012-07-25 NOTE — Progress Notes (Signed)
Subjective:    Patient ID: Gabriel Sullivan, male    DOB: [birth year], 57 y.o.   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 lansoprazole (PREVACID) 30 MG capsule Take 1 capsule (30 mg total) by mouth daily.  90 capsule  3  . Multiple Vitamin (MULTIVITAMIN PO) Take 1 tablet by mouth daily.        . traZODone (DESYREL) 150 MG tablet TAKE ONE TABLET BY MOUTH AT BEDTIME  30 tablet  11   No current facility-administered medications on file prior to visit.    No Known Allergies  Past Medical History  Diagnosis Date  . Hypertension   . GERD (gastroesophageal reflux disease)   . Anxiety   . BPH (benign prostatic hypertrophy)     Past Surgical History  Procedure Laterality Date  . Dobutamine stress echo      negative    Family History  Problem Relation Age of Onset  . Arthritis Sister   . Diabetes Neg Hx   . Hypertension Neg Hx   . Heart disease Neg Hx   . Cancer Neg Hx     History   Social History  . Marital Status: Married    Spouse Name: N/A    Number of Children: 4  . Years of Education: N/A   Occupational History  . 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 Smoking status: Former Smoker    Types: Cigarettes  . Smokeless tobacco: Former Neurosurgeon    Types: Chew    Quit date: 03/01/2011  . Alcohol Use: No   . Drug Use: No  . Sexually Active: Not on file   Other Topics Concern  . Not on file   Social History Narrative  . No narrative on file   Review of Systems  Constitutional: Negative for fatigue and unexpected weight change.       Joined gym and trying to exercise--working with trainer Wears seat belt  HENT: Positive for sneezing and tinnitus. Negative for hearing loss, congestion, rhinorrhea and dental problem.        Chronic tinnitus on left side Regular with dentist  Eyes: Negative for visual disturbance.       No diplopia or unilateral vision loss  Respiratory: Positive for chest tightness.        Slight chest sensation at first with aerobic exercise---resolves quickly  Cardiovascular: Positive for leg swelling. Negative for chest pain and palpitations.       Mild swelling in feet-- sits at work  Gastrointestinal: Negative for nausea, vomiting, abdominal pain, constipation and blood in stool.       No heartburn--went back to OTC omeprazole. Rare reflux  Endocrine: Positive for cold intolerance. Negative for heat intolerance.  Genitourinary: Positive for difficulty urinating. Negative for urgency and frequency.  Musculoskeletal: Positive for arthralgias. Negative for back pain and joint  swelling.       Knee and shoulder issues Mild back and leg pain at first with cialis--then resolved  Skin: Negative for rash.       No suspicious lesions  Allergic/Immunologic: Positive for environmental allergies. Negative for immunocompromised state.       Mild ragweed symptoms Rare meds  Neurological: Positive for headaches. Negative for dizziness and syncope.       Occ sinus tenderness  Hematological: Negative for adenopathy. Bruises/bleeds easily.  Psychiatric/Behavioral: Positive for sleep disturbance. Negative for dysphoric mood. The patient is nervous/anxious.        Objective:   Physical Exam  Constitutional: He is oriented to person, place, and time. He appears well-developed  and well-nourished. No distress.  HENT:  Head: Normocephalic and atraumatic.  Right Ear: External ear normal.  Left Ear: External ear normal.  Mouth/Throat: Oropharynx is clear and moist. No oropharyngeal exudate.  Eyes: Conjunctivae and EOM are normal. Pupils are equal, round, and reactive to light.  Neck: Normal range of motion. Neck supple. No thyromegaly present.  Cardiovascular: Normal rate, regular rhythm, normal heart sounds and intact distal pulses.  Exam reveals no gallop.   No murmur heard. Pulmonary/Chest: Effort normal and breath sounds normal. No respiratory distress. He has no wheezes. He has no rales.  Abdominal: Soft. There is no tenderness.  Musculoskeletal: He exhibits no edema and no tenderness.  Lymphadenopathy:    He has no cervical adenopathy.  Neurological: He is alert and oriented to person, place, and time.  Skin: No rash noted. No erythema.  2-36mm scabbed spot on right upper lateral thigh (from tick bite in May)---no inflammation  Psychiatric: He has a normal mood and affect. His behavior is normal.          Assessment & Plan:

## 2012-07-25 NOTE — Assessment & Plan Note (Signed)
Does okay with the trazodone Hasn't tolerated when held

## 2012-07-25 NOTE — Assessment & Plan Note (Signed)
Okay on the OTC PPI

## 2012-11-28 ENCOUNTER — Ambulatory Visit (INDEPENDENT_AMBULATORY_CARE_PROVIDER_SITE_OTHER): Payer: PRIVATE HEALTH INSURANCE | Admitting: Internal Medicine

## 2012-11-28 ENCOUNTER — Ambulatory Visit (INDEPENDENT_AMBULATORY_CARE_PROVIDER_SITE_OTHER)
Admission: RE | Admit: 2012-11-28 | Discharge: 2012-11-28 | Disposition: A | Discharge status: Home / Self Care | Payer: PRIVATE HEALTH INSURANCE | Source: Ambulatory Visit | Attending: Internal Medicine | Admitting: Internal Medicine

## 2012-11-28 ENCOUNTER — Encounter: Payer: Self-pay | Admitting: Internal Medicine

## 2012-11-28 VITALS — BP 140/90 | HR 77 | Temp 98.4°F | Wt 212.0 lb

## 2012-11-28 DIAGNOSIS — R0609 Other forms of dyspnea: Secondary | ICD-10-CM | POA: Insufficient documentation

## 2012-11-28 DIAGNOSIS — Z23 Encounter for immunization: Secondary | ICD-10-CM

## 2012-11-28 DIAGNOSIS — N400 Enlarged prostate without lower urinary tract symptoms: Secondary | ICD-10-CM

## 2012-11-28 MED ORDER — TADALAFIL 5 MG PO TABS: 5.0000 mg | ORAL_TABLET | Freq: Every day | ORAL | Status: DC

## 2012-11-28 NOTE — Assessment & Plan Note (Signed)
Feels it may have been related to his overweight Was worried about COPD---reassured that CXR and spirometry are normal  Hasn't been working out No chest pain or other specific heart symptoms  Discussed options He will work more on fitness If he has more problems, I will set up a treadmill stress test

## 2012-11-28 NOTE — Progress Notes (Signed)
  Subjective:    Patient ID: Gabriel Sullivan, male    DOB: July 05, 1955, 57 y.o.   MRN: [id number]  HPI Here to discuss the daily cialis Tried the 20mg  at first but we changed to the daily as Rx for mild BPH also But only got #4 tabs of the 5mg  cialis Then tried to get it again and they told him he had to wait 17 days more  No clear improvement in voiding---but too soon to tell  Has noticed some DOE Had hard time running the bases playing softball in past season No chest pain  Slight  Palpitations--but not like he had in the past No sig edema No dizziness or syncope No cough No fever  Was smoker but quit 25 years ago  Current Outpatient Prescriptions on File Prior to Visit  Medication Sig Dispense Refill  . Multiple Vitamin (MULTIVITAMIN PO) Take 1 tablet by mouth daily.        Marland Kitchen omeprazole (PRILOSEC) 20 MG capsule Take 20 mg by mouth daily.      . tadalafil (CIALIS) 5 MG tablet Take 1 tablet (5 mg total) by mouth daily.  30 tablet  11  . traZODone (DESYREL) 150 MG tablet TAKE ONE TABLET BY MOUTH AT BEDTIME  30 tablet  11   No current facility-administered medications on file prior to visit.    No Known Allergies  Past Medical History  Diagnosis Date  . Hypertension   . GERD (gastroesophageal reflux disease)   . Anxiety   . BPH (benign prostatic hypertrophy)     Past Surgical History  Procedure Laterality Date  . Dobutamine stress echo      negative    Family History  Problem Relation Age of Onset  . Arthritis Sister   . Diabetes Neg Hx   . Hypertension Neg Hx   . Heart disease Neg Hx   . Cancer Neg Hx     History   Social History  . Marital Status: Married    Spouse Name: N/A    Number of Children: 4  . Years of Education: N/A   Occupational History  . 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 Smoking status: Former Smoker    Types: Cigarettes  . Smokeless tobacco: Former Neurosurgeon    Types:  Chew    Quit date: 03/01/2011  . Alcohol Use: No  . Drug Use: No  . Sexual Activity: Not on file   Other Topics Concern  . Not on file   Social History Narrative  . No narrative on file      Review of Systems Sleeps okay Appetite is okay    Objective:   Physical Exam  Constitutional: He appears well-developed and well-nourished. No distress.  Neck: Normal range of motion. Neck supple. No thyromegaly present.  Cardiovascular: Normal rate, regular rhythm, normal heart sounds and intact distal pulses.  Exam reveals no gallop.   No murmur heard. Pulmonary/Chest: Effort normal and breath sounds normal. No respiratory distress. He has no wheezes. He has no rales.  Abdominal: Soft. There is no tenderness.  Musculoskeletal: He exhibits no edema and no tenderness.  Lymphadenopathy:    He has no cervical adenopathy.  Psychiatric: He has a normal mood and affect. His behavior is normal.          Assessment & Plan:

## 2012-11-28 NOTE — Patient Instructions (Signed)
If you are going to just use a medicine when you have sex--- check the prices on generic sildenafil 20mg . You would need 3 -5 of these each time you used them.

## 2012-11-28 NOTE — Assessment & Plan Note (Signed)
Didn't get the full month Rx so hard to tell Will write new Rx---he will check cash prices

## 2013-03-14 ENCOUNTER — Telehealth: Payer: Self-pay | Admitting: *Deleted

## 2013-03-14 NOTE — Telephone Encounter (Signed)
Received prior auth request for Cialis. Auth paperwork obtained and placed in your inbox.

## 2013-03-15 NOTE — Telephone Encounter (Signed)
Let him know that his insurance will not cover this for an enlarged prostate unless he has tried other, much less expensive meds for this

## 2013-03-20 NOTE — Telephone Encounter (Signed)
Received a notice from insurance that additional info is needed for prior auth. Forms placed in your inbox.

## 2013-03-21 NOTE — Telephone Encounter (Signed)
As noted, I am pretty much certain they will not cover this Form done though

## 2013-03-21 NOTE — Telephone Encounter (Signed)
Forms were submitted, pending response from insurance company. 

## 2013-03-23 NOTE — Telephone Encounter (Signed)
Cialis was denied by insurance (as anticipated), denial paperwork placed in your inbox.

## 2013-03-23 NOTE — Telephone Encounter (Signed)
Please let him know  If he wants, I now prescribe a low dose generic of viagra that usually is a lot cheaper If he wants the cialis, I am fine with prescribing it but he should call around to find the best cash price

## 2013-03-27 ENCOUNTER — Encounter: Payer: Self-pay | Admitting: *Deleted

## 2013-03-30 NOTE — Telephone Encounter (Signed)
Tried calling numbers in pt's chart and they are incorrect, work number is also wrong, it has been disconnected. Will wait for pt to call back

## 2013-07-31 ENCOUNTER — Encounter: Payer: Self-pay | Admitting: Internal Medicine

## 2013-07-31 ENCOUNTER — Ambulatory Visit (INDEPENDENT_AMBULATORY_CARE_PROVIDER_SITE_OTHER): Payer: BC Managed Care – PPO | Admitting: Internal Medicine

## 2013-07-31 VITALS — BP 110/80 | HR 81 | Temp 98.4°F | Ht 71.0 in | Wt 205.0 lb

## 2013-07-31 DIAGNOSIS — K219 Gastro-esophageal reflux disease without esophagitis: Secondary | ICD-10-CM

## 2013-07-31 DIAGNOSIS — N529 Male erectile dysfunction, unspecified: Secondary | ICD-10-CM

## 2013-07-31 DIAGNOSIS — Z125 Encounter for screening for malignant neoplasm of prostate: Secondary | ICD-10-CM

## 2013-07-31 DIAGNOSIS — Z0000 Encounter for general adult medical examination without abnormal findings: Secondary | ICD-10-CM

## 2013-07-31 DIAGNOSIS — I10 Essential (primary) hypertension: Secondary | ICD-10-CM

## 2013-07-31 LAB — COMPREHENSIVE METABOLIC PANEL
ALBUMIN: 4 g/dL (ref 3.5–5.2)
ALT: 19 U/L (ref 0–53)
AST: 18 U/L (ref 0–37)
Alkaline Phosphatase: 59 U/L (ref 39–117)
BUN: 11 mg/dL (ref 6–23)
CHLORIDE: 102 meq/L (ref 96–112)
CO2: 30 meq/L (ref 19–32)
Calcium: 9.9 mg/dL (ref 8.4–10.5)
Creatinine, Ser: 1 mg/dL (ref 0.4–1.5)
GFR: 82.22 mL/min (ref >60.00)
GLUCOSE: 113 mg/dL — AB (ref 70–99)
POTASSIUM: 5 meq/L (ref 3.5–5.1)
SODIUM: 138 meq/L (ref 135–145)
TOTAL PROTEIN: 7.1 g/dL (ref 6.0–8.3)
Total Bilirubin: 0.7 mg/dL (ref 0.2–1.2)

## 2013-07-31 LAB — T4, FREE: FREE T4: 0.97 ng/dL (ref 0.60–1.60)

## 2013-07-31 LAB — CBC WITH DIFFERENTIAL/PLATELET
BASOS ABS: 0 10*3/uL (ref 0.0–0.1)
Basophils Relative: 0.7 % (ref 0.0–3.0)
EOS PCT: 3.5 % (ref 0.0–5.0)
Eosinophils Absolute: 0.2 10*3/uL (ref 0.0–0.7)
HEMATOCRIT: 44.4 % (ref 39.0–52.0)
Hemoglobin: 14.8 g/dL (ref 13.0–17.0)
LYMPHS ABS: 1.7 10*3/uL (ref 0.7–4.0)
Lymphocytes Relative: 24.4 % (ref 12.0–46.0)
MCHC: 33.4 g/dL (ref 30.0–36.0)
MCV: 94.4 fl (ref 78.0–100.0)
MONO ABS: 0.6 10*3/uL (ref 0.1–1.0)
MONOS PCT: 8.9 % (ref 3.0–12.0)
NEUTROS PCT: 62.5 % (ref 43.0–77.0)
Neutro Abs: 4.3 10*3/uL (ref 1.4–7.7)
PLATELETS: 257 10*3/uL (ref 150.0–400.0)
RBC: 4.7 Mil/uL (ref 4.22–5.81)
RDW: 13.4 % (ref 11.5–15.5)
WBC: 6.8 10*3/uL (ref 4.0–10.5)

## 2013-07-31 LAB — LIPID PANEL
Cholesterol: 190 mg/dL (ref 0–200)
HDL: 44.8 mg/dL (ref >39.00)
LDL Cholesterol: 124 mg/dL — ABNORMAL HIGH (ref 0–99)
NonHDL: 145.2
Total CHOL/HDL Ratio: 4
Triglycerides: 104 mg/dL (ref 0.0–149.0)
VLDL: 20.8 mg/dL (ref 0.0–40.0)

## 2013-07-31 LAB — PSA: PSA: 0.66 ng/mL (ref 0.10–4.00)

## 2013-07-31 MED ORDER — SILDENAFIL CITRATE 20 MG PO TABS: 60.0000 mg | ORAL_TABLET | Freq: Every day | ORAL | Status: DC | PRN

## 2013-07-31 NOTE — Progress Notes (Signed)
Pre visit review using our clinic review tool, if applicable. No additional management support is needed unless otherwise documented below in the visit note. 

## 2013-07-31 NOTE — Assessment & Plan Note (Signed)
And libido issues cialis helped but was too expensive Will try generic sildenafil and check testosterone--- wife (and I think he) want replacement if low

## 2013-07-31 NOTE — Progress Notes (Signed)
Subjective:    Patient ID: Gabriel Sullivan, male    DOB: 1955/09/16, 58 y.o.   MRN: [id number]  HPI Here for physical Wife here--just got married 4/15  Satisfied with cialis for ED--but too much money Wife wondering about the testosterone--she notices decreased libido as well She does note a lot of stress--new medical issues for her  Satisfied with omeprazole Takes every day--uses OTC now  Voiding okay No sig nocturia No urgency or daytime problems  Current Outpatient Prescriptions on File Prior to Visit  Medication Sig Dispense Refill  . Multiple Vitamin (MULTIVITAMIN PO) Take 1 tablet by mouth daily.        Marland Kitchen omeprazole (PRILOSEC) 20 MG capsule Take 20 mg by mouth daily.      . tadalafil (CIALIS) 5 MG tablet Take 1 tablet (5 mg total) by mouth daily.  30 tablet  11  . traZODone (DESYREL) 150 MG tablet TAKE ONE TABLET BY MOUTH AT BEDTIME  30 tablet  11   No current facility-administered medications on file prior to visit.    No Known Allergies  Past Medical History  Diagnosis Date  . Hypertension   . GERD (gastroesophageal reflux disease)   . Anxiety   . BPH (benign prostatic hypertrophy)     Past Surgical History  Procedure Laterality Date  . Dobutamine stress echo      negative    Family History  Problem Relation Age of Onset  . Arthritis Sister   . Diabetes Neg Hx   . Hypertension Neg Hx   . Heart disease Neg Hx   . Cancer Neg Hx     History   Social History  . Marital Status: Married    Spouse Name: N/A    Number of Children: 4  . Years of Education: N/A   Occupational History  . sales for doors and Occupational psychologist for [employer] History Main Topics  . Smoking status: Former Smoker    Types: Cigarettes  . Smokeless tobacco: Former Systems developer    Types: Chew    Quit date: 03/01/2011  . Alcohol Use: No  . Drug Use: No  . Sexual Activity: Not on file   Other Topics Concern  . Not on file   Social History Narrative     Divorced ~2007.   Remarried 4/15   Review of Systems  Constitutional: Negative for fatigue and unexpected weight change.       Wears seat belt  HENT: Positive for hearing loss and tinnitus.        Wears hearing aides  Eyes: Positive for visual disturbance.       Some troubles with prescriptions No unilateral vision loss  Respiratory: Negative for cough, chest tightness and shortness of breath.        Mild SOB--but better Has lost some weight  Cardiovascular: Negative for chest pain, palpitations and leg swelling.  Gastrointestinal: Negative for nausea, vomiting, abdominal pain, constipation and blood in stool.  Endocrine: Negative for cold intolerance, heat intolerance, polyphagia and polyuria.  Genitourinary: Negative for urgency, frequency and difficulty urinating.  Musculoskeletal: Positive for arthralgias and back pain. Negative for joint swelling.       Mild knee and back pain--no meds  Skin: Negative for rash.       Spot on leg--mostly resolved now  Allergic/Immunologic: Positive for environmental allergies. Negative for immunocompromised state.       Mild allergies  Neurological: Positive for headaches. Negative  for dizziness, syncope, weakness, light-headedness and numbness.  Psychiatric/Behavioral: Negative for sleep disturbance and dysphoric mood. The patient is not nervous/anxious.        Sleeps well with trazodone       Objective:   Physical Exam  Constitutional: He is oriented to person, place, and time. He appears well-developed and well-nourished. No distress.  HENT:  Head: Normocephalic and atraumatic.  Right Ear: External ear normal.  Left Ear: External ear normal.  Mouth/Throat: Oropharynx is clear and moist. No oropharyngeal exudate.  Eyes: Conjunctivae are normal. Pupils are equal, round, and reactive to light.  Neck: Normal range of motion. Neck supple. No thyromegaly present.  Cardiovascular: Normal rate, regular rhythm, normal heart sounds and intact  distal pulses.  Exam reveals no gallop.   No murmur heard. Pulmonary/Chest: Effort normal and breath sounds normal. No respiratory distress. He has no wheezes. He has no rales.  Abdominal: Soft. There is no tenderness.  Musculoskeletal: He exhibits no edema.  Lymphadenopathy:    He has no cervical adenopathy.  Neurological: He is alert and oriented to person, place, and time.  Skin: No rash noted. No erythema.  Psychiatric: He has a normal mood and affect. His behavior is normal.          Assessment & Plan:

## 2013-07-31 NOTE — Assessment & Plan Note (Signed)
Generally healthy Needs to work out more Will check PSA after discussion

## 2013-07-31 NOTE — Assessment & Plan Note (Signed)
BP Readings from Last 3 Encounters:  07/31/13 110/80  11/28/12 140/90  07/25/12 120/80   Good control No changes needed

## 2013-07-31 NOTE — Assessment & Plan Note (Signed)
Quiet on PPI 

## 2013-08-03 LAB — TESTOSTERONE,FREE AND TOTAL
TESTOSTERONE FREE: 7.6 pg/mL (ref 7.2–24.0)
Testosterone: 679 ng/dL (ref 348–1197)

## 2013-08-10 ENCOUNTER — Other Ambulatory Visit: Payer: Self-pay | Admitting: Internal Medicine

## 2013-08-10 NOTE — Telephone Encounter (Signed)
Last filled 06/2012 with 11 refills--pt had CPE on 07/31/13--please advise if okay to refill

## 2013-08-10 NOTE — Telephone Encounter (Signed)
Okay to fill for a year

## 2014-01-25 HISTORY — PX: CORNEAL TRANSPLANT: SHX108

## 2014-08-07 ENCOUNTER — Encounter: Payer: Self-pay | Admitting: Internal Medicine

## 2014-08-07 ENCOUNTER — Ambulatory Visit (INDEPENDENT_AMBULATORY_CARE_PROVIDER_SITE_OTHER): Payer: BLUE CROSS/BLUE SHIELD | Admitting: Internal Medicine

## 2014-08-07 VITALS — BP 128/80 | HR 76 | Temp 97.4°F | Ht 71.0 in | Wt 195.0 lb

## 2014-08-07 DIAGNOSIS — Z23 Encounter for immunization: Secondary | ICD-10-CM | POA: Diagnosis not present

## 2014-08-07 DIAGNOSIS — Z0000 Encounter for general adult medical examination without abnormal findings: Secondary | ICD-10-CM | POA: Diagnosis not present

## 2014-08-07 DIAGNOSIS — K219 Gastro-esophageal reflux disease without esophagitis: Secondary | ICD-10-CM

## 2014-08-07 DIAGNOSIS — G479 Sleep disorder, unspecified: Secondary | ICD-10-CM | POA: Diagnosis not present

## 2014-08-07 LAB — COMPREHENSIVE METABOLIC PANEL
ALK PHOS: 56 U/L (ref 39–117)
ALT: 17 U/L (ref 0–53)
AST: 19 U/L (ref 0–37)
Albumin: 4.2 g/dL (ref 3.5–5.2)
BILIRUBIN TOTAL: 0.3 mg/dL (ref 0.2–1.2)
BUN: 12 mg/dL (ref 6–23)
CALCIUM: 9.6 mg/dL (ref 8.4–10.5)
CO2: 29 mEq/L (ref 19–32)
Chloride: 103 mEq/L (ref 96–112)
Creatinine, Ser: 1.06 mg/dL (ref 0.40–1.50)
GFR: 76.59 mL/min (ref >60.00)
GLUCOSE: 114 mg/dL — AB (ref 70–99)
Potassium: 5 mEq/L (ref 3.5–5.1)
SODIUM: 140 meq/L (ref 135–145)
Total Protein: 7.4 g/dL (ref 6.0–8.3)

## 2014-08-07 LAB — CBC WITH DIFFERENTIAL/PLATELET
Basophils Absolute: 0 K/uL (ref 0.0–0.1)
Basophils Relative: 0.5 % (ref 0.0–3.0)
Eosinophils Absolute: 0.2 K/uL (ref 0.0–0.7)
Eosinophils Relative: 3.1 % (ref 0.0–5.0)
HCT: 45.8 % (ref 39.0–52.0)
Hemoglobin: 15.3 g/dL (ref 13.0–17.0)
Lymphocytes Relative: 21.2 % (ref 12.0–46.0)
Lymphs Abs: 1.7 K/uL (ref 0.7–4.0)
MCHC: 33.5 g/dL (ref 30.0–36.0)
MCV: 94.8 fl (ref 78.0–100.0)
Monocytes Absolute: 0.7 K/uL (ref 0.1–1.0)
Monocytes Relative: 9 % (ref 3.0–12.0)
Neutro Abs: 5.3 K/uL (ref 1.4–7.7)
Neutrophils Relative %: 66.2 % (ref 43.0–77.0)
Platelets: 317 K/uL (ref 150.0–400.0)
RBC: 4.83 Mil/uL (ref 4.22–5.81)
RDW: 13.5 % (ref 11.5–15.5)
WBC: 8 K/uL (ref 4.0–10.5)

## 2014-08-07 NOTE — Assessment & Plan Note (Signed)
Quiet on PPI 

## 2014-08-07 NOTE — Progress Notes (Signed)
Pre visit review using our clinic review tool, if applicable. No additional management support is needed unless otherwise documented below in the visit note. 

## 2014-08-07 NOTE — Addendum Note (Signed)
Addended by: Despina Hidden on: 08/07/2014 09:56 AM   Modules accepted: Orders

## 2014-08-07 NOTE — Progress Notes (Signed)
Subjective:    Patient ID: Gabriel Sullivan, male    DOB: [birth year], 57 y.o.   MRN: [id number]  HPI Here for physical  Having problems with his marriage She is in behavioral health-- committed yesterday He left due to all her issues--then she tried to start argument which he didn't get into Then she cut herself and took pills Looking to get divorce  Has lost 10# or more just recently--largely from stress Tries to walk regularly Work is okay--different type of stress than before  Sleeps okay with trazodone (takes 75mg  nightly)  Takes the OTC omeprazole Controls heartburn No swallowing problems  Current Outpatient Prescriptions on File Prior to Visit  Medication Sig Dispense Refill  . Multiple Vitamin (MULTIVITAMIN PO) Take 1 tablet by mouth daily.      Marland Kitchen omeprazole (PRILOSEC) 20 MG capsule Take 20 mg by mouth daily.    . sildenafil (REVATIO) 20 MG tablet Take 3-5 tablets (60-100 mg total) by mouth daily as needed. 50 tablet 11  . traZODone (DESYREL) 150 MG tablet TAKE ONE TABLET BY MOUTH AT BEDTIME 30 tablet 11   No current facility-administered medications on file prior to visit.    No Known Allergies  Past Medical History  Diagnosis Date  . Hypertension   . GERD (gastroesophageal reflux disease)   . Anxiety   . BPH (benign prostatic hypertrophy)     Past Surgical History  Procedure Laterality Date  . Dobutamine stress echo      negative    Family History  Problem Relation Age of Onset  . Arthritis Sister   . Diabetes Neg Hx   . Hypertension Neg Hx   . Heart disease Neg Hx   . Cancer Neg Hx   . Stroke Mother     History   Social History  . Marital Status: Married    Spouse Name: N/A  . Number of Children: 4  . Years of Education: N/A   Occupational History  . sales for doors and Occupational psychologist for [employer] History Main Topics  . Smoking status: Former Smoker    Types: Cigarettes  . Smokeless tobacco: Former Systems developer      Types: Chew    Quit date: 03/01/2011  . Alcohol Use: No  . Drug Use: No  . Sexual Activity: Not on file   Other Topics Concern  . Not on file   Social History Narrative   Divorced ~2007.   Remarried 4/15   Review of Systems  Constitutional: Negative for fatigue.       Wears seat belt  HENT: Positive for hearing loss and tinnitus. Negative for dental problem.        Bilateral hearing aides Keeps up with dentist  Eyes: Negative for visual disturbance.       Has had 2 corneal transplants---doing well now  Respiratory: Negative for cough, chest tightness and shortness of breath.   Cardiovascular: Negative for chest pain, palpitations and leg swelling.  Gastrointestinal: Negative for nausea, vomiting, constipation and blood in stool.  Endocrine: Negative for polydipsia and polyuria.  Genitourinary: Positive for difficulty urinating. Negative for urgency.       Occ mild dribbling--not burdensome  Musculoskeletal: Negative for joint swelling and arthralgias.  Skin: Negative for rash.       No suspicious lesions  Allergic/Immunologic: Positive for environmental allergies. Negative for immunocompromised state.  Neurological: Negative for dizziness, syncope, weakness, light-headedness and numbness.  Occ mild headaches  Hematological: Negative for adenopathy. Does not bruise/bleed easily.  Psychiatric/Behavioral: Positive for sleep disturbance. Negative for dysphoric mood. The patient is not nervous/anxious.        Situational stress with wife       Objective:   Physical Exam  Constitutional: He is oriented to person, place, and time. He appears well-developed and well-nourished. No distress.  HENT:  Head: Normocephalic and atraumatic.  Right Ear: External ear normal.  Left Ear: External ear normal.  Mouth/Throat: Oropharynx is clear and moist. No oropharyngeal exudate.  Eyes: Conjunctivae and EOM are normal. Pupils are equal, round, and reactive to light.  Neck: Normal  range of motion. Neck supple. No thyromegaly present.  Cardiovascular: Normal rate, regular rhythm, normal heart sounds and intact distal pulses.  Exam reveals no gallop.   No murmur heard. Pulmonary/Chest: Effort normal and breath sounds normal. No respiratory distress. He has no wheezes. He has no rales.  Abdominal: Soft. There is no tenderness.  Musculoskeletal: He exhibits no edema or tenderness.  Lymphadenopathy:    He has no cervical adenopathy.  Neurological: He is alert and oriented to person, place, and time.  Skin: No rash noted. No erythema.  Psychiatric: He has a normal mood and affect. His behavior is normal.          Assessment & Plan:

## 2014-08-07 NOTE — Assessment & Plan Note (Signed)
Does well on the trazodone

## 2014-08-07 NOTE — Assessment & Plan Note (Signed)
Healthy Has lost some weight Tdap today Colon due 2020 Defer PSA till next year at least

## 2014-09-07 ENCOUNTER — Other Ambulatory Visit: Payer: Self-pay | Admitting: Internal Medicine

## 2014-10-17 ENCOUNTER — Other Ambulatory Visit: Payer: Self-pay | Admitting: Internal Medicine

## 2015-04-24 ENCOUNTER — Encounter: Payer: Self-pay | Admitting: Internal Medicine

## 2015-08-13 ENCOUNTER — Ambulatory Visit (INDEPENDENT_AMBULATORY_CARE_PROVIDER_SITE_OTHER): Payer: Managed Care, Other (non HMO) | Admitting: Internal Medicine

## 2015-08-13 ENCOUNTER — Encounter: Payer: Self-pay | Admitting: Internal Medicine

## 2015-08-13 VITALS — BP 136/90 | HR 62 | Temp 98.4°F | Ht 69.5 in | Wt 208.0 lb

## 2015-08-13 DIAGNOSIS — Z0000 Encounter for general adult medical examination without abnormal findings: Secondary | ICD-10-CM

## 2015-08-13 DIAGNOSIS — R7301 Impaired fasting glucose: Secondary | ICD-10-CM | POA: Diagnosis not present

## 2015-08-13 DIAGNOSIS — I10 Essential (primary) hypertension: Secondary | ICD-10-CM | POA: Diagnosis not present

## 2015-08-13 DIAGNOSIS — K219 Gastro-esophageal reflux disease without esophagitis: Secondary | ICD-10-CM

## 2015-08-13 DIAGNOSIS — Z125 Encounter for screening for malignant neoplasm of prostate: Secondary | ICD-10-CM

## 2015-08-13 LAB — CBC WITH DIFFERENTIAL/PLATELET
BASOS PCT: 0.6 % (ref 0.0–3.0)
Basophils Absolute: 0 10*3/uL (ref 0.0–0.1)
EOS PCT: 5.3 % — AB (ref 0.0–5.0)
Eosinophils Absolute: 0.4 10*3/uL (ref 0.0–0.7)
HEMATOCRIT: 45 % (ref 39.0–52.0)
Hemoglobin: 15.1 g/dL (ref 13.0–17.0)
Lymphocytes Relative: 25.1 % (ref 12.0–46.0)
Lymphs Abs: 2.1 10*3/uL (ref 0.7–4.0)
MCHC: 33.5 g/dL (ref 30.0–36.0)
MCV: 94 fl (ref 78.0–100.0)
Monocytes Absolute: 0.7 10*3/uL (ref 0.1–1.0)
Monocytes Relative: 8 % (ref 3.0–12.0)
Neutro Abs: 5.1 10*3/uL (ref 1.4–7.7)
Neutrophils Relative %: 61 % (ref 43.0–77.0)
Platelets: 275 10*3/uL (ref 150.0–400.0)
RBC: 4.78 Mil/uL (ref 4.22–5.81)
RDW: 13.6 % (ref 11.5–15.5)
WBC: 8.3 10*3/uL (ref 4.0–10.5)

## 2015-08-13 LAB — COMPREHENSIVE METABOLIC PANEL
ALBUMIN: 4.1 g/dL (ref 3.5–5.2)
ALT: 19 U/L (ref 0–53)
AST: 16 U/L (ref 0–37)
Alkaline Phosphatase: 57 U/L (ref 39–117)
BUN: 12 mg/dL (ref 6–23)
CHLORIDE: 103 meq/L (ref 96–112)
CO2: 30 mEq/L (ref 19–32)
CREATININE: 0.99 mg/dL (ref 0.40–1.50)
Calcium: 9.7 mg/dL (ref 8.4–10.5)
GFR: 82.57 mL/min (ref >60.00)
Glucose, Bld: 111 mg/dL — ABNORMAL HIGH (ref 70–99)
Potassium: 5.1 mEq/L (ref 3.5–5.1)
Sodium: 139 mEq/L (ref 135–145)
Total Bilirubin: 0.5 mg/dL (ref 0.2–1.2)
Total Protein: 7 g/dL (ref 6.0–8.3)

## 2015-08-13 LAB — T4, FREE: Free T4: 0.94 ng/dL (ref 0.60–1.60)

## 2015-08-13 LAB — HEMOGLOBIN A1C: Hgb A1c MFr Bld: 5.7 % (ref 4.6–6.5)

## 2015-08-13 LAB — PSA: PSA: 0.71 ng/mL (ref 0.10–4.00)

## 2015-08-13 NOTE — Assessment & Plan Note (Signed)
BP Readings from Last 3 Encounters:  08/13/15 136/90  08/07/14 128/80  07/31/13 110/80   Borderline He will work on fitness Hold off on Rx

## 2015-08-13 NOTE — Patient Instructions (Signed)
DASH Eating Plan  DASH stands for "Dietary Approaches to Stop Hypertension." The DASH eating plan is a healthy eating plan that has been shown to reduce high blood pressure (hypertension). Additional health benefits may include reducing the risk of type 2 diabetes mellitus, heart disease, and stroke. The DASH eating plan may also help with weight loss.  WHAT DO I NEED TO KNOW ABOUT THE DASH EATING PLAN?  For the DASH eating plan, you will follow these general guidelines:  · Choose foods with a percent daily value for sodium of less than 5% (as listed on the food label).  · Use salt-free seasonings or herbs instead of table salt or sea salt.  · Check with your health care provider or pharmacist before using salt substitutes.  · Eat lower-sodium products, often labeled as "lower sodium" or "no salt added."  · Eat fresh foods.  · Eat more vegetables, fruits, and low-fat dairy products.  · Choose whole grains. Look for the word "whole" as the first word in the ingredient list.  · Choose fish and skinless chicken or turkey more often than red meat. Limit fish, poultry, and meat to 6 oz (170 g) each day.  · Limit sweets, desserts, sugars, and sugary drinks.  · Choose heart-healthy fats.  · Limit cheese to 1 oz (28 g) per day.  · Eat more home-cooked food and less restaurant, buffet, and fast food.  · Limit fried foods.  · Cook foods using methods other than frying.  · Limit canned vegetables. If you do use them, rinse them well to decrease the sodium.  · When eating at a restaurant, ask that your food be prepared with less salt, or no salt if possible.  WHAT FOODS CAN I EAT?  Seek help from a dietitian for individual calorie needs.  Grains  Whole grain or whole wheat bread. Brown rice. Whole grain or whole wheat pasta. Quinoa, bulgur, and whole grain cereals. Low-sodium cereals. Corn or whole wheat flour tortillas. Whole grain cornbread. Whole grain crackers. Low-sodium crackers.  Vegetables  Fresh or frozen vegetables  (raw, steamed, roasted, or grilled). Low-sodium or reduced-sodium tomato and vegetable juices. Low-sodium or reduced-sodium tomato sauce and paste. Low-sodium or reduced-sodium canned vegetables.   Fruits  All fresh, canned (in natural juice), or frozen fruits.  Meat and Other Protein Products  Ground beef (85% or leaner), grass-fed beef, or beef trimmed of fat. Skinless chicken or turkey. Ground chicken or turkey. Pork trimmed of fat. All fish and seafood. Eggs. Dried beans, peas, or lentils. Unsalted nuts and seeds. Unsalted canned beans.  Dairy  Low-fat dairy products, such as skim or 1% milk, 2% or reduced-fat cheeses, low-fat ricotta or cottage cheese, or plain low-fat yogurt. Low-sodium or reduced-sodium cheeses.  Fats and Oils  Tub margarines without trans fats. Light or reduced-fat mayonnaise and salad dressings (reduced sodium). Avocado. Safflower, olive, or canola oils. Natural peanut or almond butter.  Other  Unsalted popcorn and pretzels.  The items listed above may not be a complete list of recommended foods or beverages. Contact your dietitian for more options.  WHAT FOODS ARE NOT RECOMMENDED?  Grains  White bread. White pasta. White rice. Refined cornbread. Bagels and croissants. Crackers that contain trans fat.  Vegetables  Creamed or fried vegetables. Vegetables in a cheese sauce. Regular canned vegetables. Regular canned tomato sauce and paste. Regular tomato and vegetable juices.  Fruits  Dried fruits. Canned fruit in light or heavy syrup. Fruit juice.  Meat and Other Protein   Products  Fatty cuts of meat. Ribs, chicken wings, bacon, sausage, bologna, salami, chitterlings, fatback, hot dogs, bratwurst, and packaged luncheon meats. Salted nuts and seeds. Canned beans with salt.  Dairy  Whole or 2% milk, cream, half-and-half, and cream cheese. Whole-fat or sweetened yogurt. Full-fat cheeses or blue cheese. Nondairy creamers and whipped toppings. Processed cheese, cheese spreads, or cheese  curds.  Condiments  Onion and garlic salt, seasoned salt, table salt, and sea salt. Canned and packaged gravies. Worcestershire sauce. Tartar sauce. Barbecue sauce. Teriyaki sauce. Soy sauce, including reduced sodium. Steak sauce. Fish sauce. Oyster sauce. Cocktail sauce. Horseradish. Ketchup and mustard. Meat flavorings and tenderizers. Bouillon cubes. Hot sauce. Tabasco sauce. Marinades. Taco seasonings. Relishes.  Fats and Oils  Butter, stick margarine, lard, shortening, ghee, and bacon fat. Coconut, palm kernel, or palm oils. Regular salad dressings.  Other  Pickles and olives. Salted popcorn and pretzels.  The items listed above may not be a complete list of foods and beverages to avoid. Contact your dietitian for more information.  WHERE CAN I FIND MORE INFORMATION?  National Heart, Lung, and [employer] Institute: www.nhlbi.nih.gov/health/health-topics/topics/dash/     This information is not intended to replace advice given to you by your health care provider. Make sure you discuss any questions you have with your health care provider.     Document Released: 12/31/2010 Document Revised: 02/01/2014 Document Reviewed: 11/15/2012  Elsevier Interactive Patient Education ©2016 [employer]

## 2015-08-13 NOTE — Assessment & Plan Note (Signed)
Healthy but out of shape Needs to work on fitness Colon due 2020 Will check PSA after discussion Yearly flu vaccine

## 2015-08-13 NOTE — Assessment & Plan Note (Signed)
Discussed weight loss Consider metformin if worse--he would accept

## 2015-08-13 NOTE — Progress Notes (Signed)
Pre visit review using our clinic review tool, if applicable. No additional management support is needed unless otherwise documented below in the visit note. 

## 2015-08-13 NOTE — Progress Notes (Signed)
Subjective:    Patient ID: Gabriel Sullivan, male    DOB: 01/04/1956, 61 y.o.   MRN: [id number]  HPI Here for physical  Did wind up separating from wife Divorce will finalize next month bought his own house--he is working on it to get ready to move in Is in new relationship---just friendship  Still on the trazodone Comfortable continuing this No depression or anxiety  Uses the OTC omeprazole every day No heartburn or dysphagia while taking Hasn't tolerated decreased dose---symptoms start within a few days  Corneal transplants done last spring  Current Outpatient Prescriptions on File Prior to Visit  Medication Sig Dispense Refill  . Multiple Vitamin (MULTIVITAMIN PO) Take 1 tablet by mouth daily.      Marland Kitchen omeprazole (PRILOSEC) 20 MG capsule Take 20 mg by mouth daily.    . sildenafil (REVATIO) 20 MG tablet TAKE 3-5 TABLETS BY MOUTH DAILY AS NEEDED 50 tablet 11  . traZODone (DESYREL) 150 MG tablet TAKE ONE TABLET BY MOUTH AT BEDTIME 30 tablet 11   No current facility-administered medications on file prior to visit.    No Known Allergies  Past Medical History  Diagnosis Date  . Hypertension   . GERD (gastroesophageal reflux disease)   . Anxiety   . BPH (benign prostatic hypertrophy)     Past Surgical History  Procedure Laterality Date  . Dobutamine stress echo      negative    Family History  Problem Relation Age of Onset  . Arthritis Sister   . Diabetes Neg Hx   . Hypertension Neg Hx   . Heart disease Neg Hx   . Cancer Neg Hx   . Stroke Mother     Social History   Social History  . Marital Status: Legally Separated    Spouse Name: N/A  . Number of Children: 4  . Years of Education: N/A   Occupational History  . sales for doors and Occupational psychologist for [employer] History Main Topics  . Smoking status: Former Smoker    Types: Cigarettes  . Smokeless tobacco: Former Systems developer    Types: Chew    Quit date: 03/01/2011  . Alcohol  Use: No  . Drug Use: No  . Sexual Activity: Not on file   Other Topics Concern  . Not on file   Social History Narrative   Divorced ~2007.   Remarried 4/15   Review of Systems  Constitutional: Negative for fatigue.       No sig fitness efforts--stopped walking when moved into apt. Wears seat belt  HENT: Positive for tinnitus. Negative for trouble swallowing.        Hearing aides now Keeps up with dentist  Eyes: Negative for visual disturbance.       No diplopia or unilateral vision loss  Respiratory: Negative for cough, chest tightness and shortness of breath.        Easy DOE  Cardiovascular: Negative for chest pain, palpitations and leg swelling.  Gastrointestinal: Negative for nausea, vomiting, abdominal pain, constipation and blood in stool.  Endocrine: Negative for polydipsia and polyuria.  Genitourinary: Positive for difficulty urinating. Negative for urgency and frequency.       Stream is slow No sex now--may want the sildenafil  Musculoskeletal: Negative for joint swelling and arthralgias.       Mild back pain at times  Skin: Negative for rash.       No suspicious lesions  Allergic/Immunologic: Positive  for environmental allergies. Negative for immunocompromised state.       Occ sudafed  Neurological: Positive for headaches. Negative for dizziness, syncope, weakness and light-headedness.  Hematological: Negative for adenopathy. Does not bruise/bleed easily.  Psychiatric/Behavioral: Positive for sleep disturbance. Negative for dysphoric mood. The patient is not nervous/anxious.        Objective:   Physical Exam  Constitutional: He is oriented to person, place, and time. He appears well-developed and well-nourished. No distress.  HENT:  Head: Normocephalic and atraumatic.  Right Ear: External ear normal.  Left Ear: External ear normal.  Mouth/Throat: Oropharynx is clear and moist. No oropharyngeal exudate.  Eyes: Conjunctivae are normal. Pupils are equal, round,  and reactive to light.  Neck: Normal range of motion. Neck supple. No thyromegaly present.  Cardiovascular: Normal rate, regular rhythm, normal heart sounds and intact distal pulses.  Exam reveals no gallop.   No murmur heard. Pulmonary/Chest: Effort normal and breath sounds normal. No respiratory distress. He has no wheezes. He has no rales.  Abdominal: Soft. There is no tenderness.  Musculoskeletal: He exhibits no edema or tenderness.  Lymphadenopathy:    He has no cervical adenopathy.  Neurological: He is alert and oriented to person, place, and time.  Skin: No rash noted. No erythema.  Psychiatric: He has a normal mood and affect. His behavior is normal.          Assessment & Plan:

## 2015-08-13 NOTE — Addendum Note (Signed)
Addended by: Daralene Milch C on: 08/13/2015 10:26 AM   Modules accepted: Miquel Dunn

## 2015-08-13 NOTE — Assessment & Plan Note (Signed)
Does fine with the PPI

## 2015-10-28 ENCOUNTER — Emergency Department (HOSPITAL_BASED_OUTPATIENT_CLINIC_OR_DEPARTMENT_OTHER): Payer: Managed Care, Other (non HMO)

## 2015-10-28 ENCOUNTER — Emergency Department (HOSPITAL_BASED_OUTPATIENT_CLINIC_OR_DEPARTMENT_OTHER)
Admission: EM | Admit: 2015-10-28 | Discharge: 2015-10-28 | Disposition: A | Discharge status: Home / Self Care | Payer: Managed Care, Other (non HMO) | Attending: Emergency Medicine | Admitting: Emergency Medicine

## 2015-10-28 ENCOUNTER — Encounter (HOSPITAL_BASED_OUTPATIENT_CLINIC_OR_DEPARTMENT_OTHER): Payer: Self-pay | Admitting: *Deleted

## 2015-10-28 DIAGNOSIS — Z87891 Personal history of nicotine dependence: Secondary | ICD-10-CM | POA: Insufficient documentation

## 2015-10-28 DIAGNOSIS — M79662 Pain in left lower leg: Secondary | ICD-10-CM | POA: Diagnosis not present

## 2015-10-28 DIAGNOSIS — Y9364 Activity, baseball: Secondary | ICD-10-CM | POA: Insufficient documentation

## 2015-10-28 DIAGNOSIS — Z79899 Other long term (current) drug therapy: Secondary | ICD-10-CM | POA: Diagnosis not present

## 2015-10-28 DIAGNOSIS — M79605 Pain in left leg: Secondary | ICD-10-CM | POA: Diagnosis present

## 2015-10-28 DIAGNOSIS — X501XXA Overexertion from prolonged static or awkward postures, initial encounter: Secondary | ICD-10-CM | POA: Insufficient documentation

## 2015-10-28 DIAGNOSIS — Y929 Unspecified place or not applicable: Secondary | ICD-10-CM | POA: Diagnosis not present

## 2015-10-28 DIAGNOSIS — I10 Essential (primary) hypertension: Secondary | ICD-10-CM | POA: Insufficient documentation

## 2015-10-28 DIAGNOSIS — Y998 Other external cause status: Secondary | ICD-10-CM | POA: Insufficient documentation

## 2015-10-28 MED ORDER — OXYCODONE-ACETAMINOPHEN 5-325 MG PO TABS: 1.0000 | ORAL_TABLET | ORAL | 0 refills | Status: DC | PRN

## 2015-10-28 MED ORDER — CYCLOBENZAPRINE HCL 10 MG PO TABS: 10.0000 mg | ORAL_TABLET | Freq: Two times a day (BID) | ORAL | 0 refills | Status: DC | PRN

## 2015-10-28 MED ORDER — IBUPROFEN 800 MG PO TABS: 800.0000 mg | ORAL_TABLET | Freq: Three times a day (TID) | ORAL | 0 refills | Status: DC

## 2015-10-28 NOTE — ED Provider Notes (Signed)
[facility] Provider Note   CSN: [id number] Arrival date & time: 10/28/15  2020  By signing my name below, I, Dolores Hoose, attest that this documentation has been prepared under the direction and in the presence of non-physician practitioner, Delsa Grana, PA-C. Electronically Signed: Dolores Hoose, Scribe. 10/28/2015. 9:06 PM.  History   Chief Complaint Chief Complaint  Patient presents with  . Leg Injury   The history is provided by the patient. No language interpreter was used.    HPI Comments:  Gabriel Sullivan is a 60 y.o. male with pmhx of HTN who presents to the Emergency Department complaining of sudden-onset left calf pain onset earlier today. He describes his symptoms as a sharp pain that is worsened by palpation and walking. Pt has tried icing the area with minimal relief. He denies any numbness to the area. Pt states that he was running bases while playing baseball when he heard a pop and his calf became very tender and it became difficult to walk.   Past Medical History:  Diagnosis Date  . Anxiety   . BPH (benign prostatic hypertrophy)   . GERD (gastroesophageal reflux disease)   . Hypertension     Patient Active Problem List   Diagnosis Date Noted  . Impaired fasting glucose 08/13/2015  . BPH (benign prostatic hypertrophy)   . Erectile dysfunction 05/24/2012  . Routine general medical examination at a health care facility 03/29/2011  . Sleep disturbance 12/03/2008  . GERD 05/22/2008  . Essential hypertension, benign 01/11/2007    Past Surgical History:  Procedure Laterality Date  . CORNEAL TRANSPLANT  2016  . DOBUTAMINE STRESS ECHO     negative    Home Medications    Prior to Admission medications   Medication Sig Start Date End Date Taking? Authorizing Provider  cyclobenzaprine (FLEXERIL) 10 MG tablet Take 1 tablet (10 mg total) by mouth 2 (two) times daily as needed for muscle spasms. 10/28/15   Delsa Grana, PA-C  ibuprofen (ADVIL,MOTRIN) 800  MG tablet Take 1 tablet (800 mg total) by mouth 3 (three) times daily. 10/28/15   Delsa Grana, PA-C  Multiple Vitamin (MULTIVITAMIN PO) Take 1 tablet by mouth daily.      Historical Provider, MD  omeprazole (PRILOSEC) 20 MG capsule Take 20 mg by mouth daily.    Historical Provider, MD  oxyCODONE-acetaminophen (PERCOCET/ROXICET) 5-325 MG tablet Take 1 tablet by mouth every 4 (four) hours as needed for severe pain. May take 2 tablets PO q 6 hours for severe pain - Do not take with Tylenol as this tablet already contains tylenol 10/28/15   Delsa Grana, PA-C  sildenafil (REVATIO) 20 MG tablet TAKE 3-5 TABLETS BY MOUTH DAILY AS NEEDED 09/09/14   Venia Carbon, MD  traZODone (DESYREL) 150 MG tablet TAKE ONE TABLET BY MOUTH AT BEDTIME 10/17/14   Venia Carbon, MD    Family History Family History  Problem Relation Age of Onset  . Arthritis Sister   . Stroke Mother   . Diabetes Neg Hx   . Hypertension Neg Hx   . Heart disease Neg Hx   . Cancer Neg Hx     Social History Social History  Substance Use Topics  . Smoking status: Former Smoker    Types: Cigarettes  . Smokeless tobacco: Former Systems developer    Types: Chew    Quit date: 03/01/2011  . Alcohol use No     Allergies   Review of patient's allergies indicates no known allergies.  Review of Systems Review of Systems  Musculoskeletal: Positive for myalgias.  Neurological: Negative for numbness.  All other systems reviewed and are negative.  Physical Exam Updated Vital Signs BP 140/78 (BP Location: Right Arm)   Pulse 66   Temp 99.4 F (37.4 C) (Oral)   Resp 20   Wt 95.3 kg   SpO2 99%   BMI 30.57 kg/m   Physical Exam  Constitutional: He is oriented to person, place, and time. He appears well-developed and well-nourished. No distress.  HENT:  Head: Normocephalic and atraumatic.  Right Ear: External ear normal.  Left Ear: External ear normal.  Nose: Nose normal.  Mouth/Throat: Oropharynx is clear and moist. No oropharyngeal  exudate.  Eyes: Conjunctivae and EOM are normal. Pupils are equal, round, and reactive to light. Right eye exhibits no discharge. Left eye exhibits no discharge. No scleral icterus.  Neck: Normal range of motion. Neck supple. No JVD present. No tracheal deviation present.  Cardiovascular: Normal rate, regular rhythm and intact distal pulses.   Pulmonary/Chest: Effort normal and breath sounds normal. No stridor. No respiratory distress.  Abdominal: He exhibits no distension.  Musculoskeletal: He exhibits tenderness. He exhibits no edema or deformity.       Left ankle: He exhibits decreased range of motion. He exhibits no swelling, no ecchymosis, no deformity and normal pulse. No tenderness. Achilles tendon exhibits pain. Achilles tendon exhibits no defect.       Left lower leg: He exhibits tenderness. He exhibits no bony tenderness, no swelling and no edema.       Legs: Tenderness to palpation of left calf. Unable to perform [facility] test secondary to pain. No achilles deficit palpation and mild tenderness to achilles tendon. Normal plantarflexion, unable to dorsiflex.   Lymphadenopathy:    He has no cervical adenopathy.  Neurological: He is alert and oriented to person, place, and time. He exhibits normal muscle tone. Coordination normal.  Normal sensation to light touch in bilateral LE  Skin: Skin is warm and dry. No rash noted. He is not diaphoretic. No erythema. No pallor.  Psychiatric: He has a normal mood and affect. His behavior is normal. Judgment and thought content normal.  Nursing note and vitals reviewed.    ED Treatments / Results  DIAGNOSTIC STUDIES:  Oxygen Saturation is 97% on RA, normal by my interpretation.    COORDINATION OF CARE:  9:42 PM Discussed treatment plan with pt at bedside which includes pain medication and referral to orthopedics and pt agreed to plan.  Labs (all labs ordered are listed, but only abnormal results are displayed) Labs Reviewed - No data to  display  EKG  EKG Interpretation None       Radiology Dg Tibia/fibula Left  Result Date: 10/29/2015 CLINICAL DATA:  Left tibial and fibular pain EXAM: LEFT TIBIA AND FIBULA - 2 VIEW COMPARISON:  None. FINDINGS: There is no evidence of fracture or other focal bone lesions. There is mild soft tissue induration of the distal leg posteriorly involving portions of Keger's fat pad. There may be an accessory soleus muscle given soft tissue prominence posteriorly extending to the ankle joint. IMPRESSION: Nonspecific soft tissue swelling posteriorly of the distal leg. Some of the soft tissue prominence may be related to an accessory soleus muscle. No acute osseous abnormality. Electronically Signed   By: Ashley Royalty M.D.   On: 10/29/2015 00:01   Dg Ankle Complete Left  Result Date: 10/28/2015 CLINICAL DATA:  Left ankle pain and popping sensation at the calf  muscle wall running. EXAM: LEFT ANKLE COMPLETE - 3+ VIEW COMPARISON:  None. FINDINGS: There is no evidence of fracture, dislocation, or joint effusion. Small ossicle noted adjacent to the medial malleolus. There is an 8 mm plantar calcaneal enthesophyte. There is no evidence of arthropathy or other focal bone abnormality. There is soft tissue induration posteriorly along the distal leg. IMPRESSION: No acute osseous abnormality. There is however soft tissue induration along the posterior aspect of the distal leg. Soft tissue injury suggesting muscle strain, tendon tear and/or edema could have this appearance. Nonemergent MRI would be the study of choice for further correlation if clinically warranted. Electronically Signed   By: Ashley Royalty M.D.   On: 10/28/2015 23:57    Procedures Procedures (including critical care time)  Medications Ordered in ED Medications - No data to display   Initial Impression / Assessment and Plan / ED Course  I have reviewed the triage vital signs and the nursing notes.  Pertinent labs & imaging results that were  available during my care of the patient were reviewed by me and considered in my medical decision making (see chart for details).  Clinical Course   Pt with left calf pain s/p calf "strain" one week ago, gradually improving, then tonight sprinted 4-5 strides and felt a pop and had immediate left calf pain, with "lump" and inability to dorsiflex foot.   Clinically may be partial achilles tendon tear, or muscle strain.  Xray obtained to eval for possible avulsion.  No palpable achilles defect.  Case discussed with Dr. Dayna Barker who evaluated pt and performed bedside ultrasound.   Xray significant for soft tissue induration along posterior aspect of distal leg.  Suggest non-emergent MRI. Pt placed in cam walker, given crutches, NSAIDS, pain meds, and ortho follow up.  Encouraged RICE tx.   Final Clinical Impressions(s) / ED Diagnoses   Final diagnoses:  Pain of left calf    New Prescriptions Discharge Medication List as of 10/28/2015 10:53 PM    START taking these medications   Details  cyclobenzaprine (FLEXERIL) 10 MG tablet Take 1 tablet (10 mg total) by mouth 2 (two) times daily as needed for muscle spasms., Starting Tue 10/28/2015, Print    ibuprofen (ADVIL,MOTRIN) 800 MG tablet Take 1 tablet (800 mg total) by mouth 3 (three) times daily., Starting Tue 10/28/2015, Print    oxyCODONE-acetaminophen (PERCOCET/ROXICET) 5-325 MG tablet Take 1 tablet by mouth every 4 (four) hours as needed for severe pain. May take 2 tablets PO q 6 hours for severe pain - Do not take with Tylenol as this tablet already contains tylenol, Starting Tue 10/28/2015, Print       I personally performed the services described in this documentation, which was scribed in my presence. The recorded information has been reviewed and is accurate.      Delsa Grana, PA-C 10/29/15 [id number]    Merrily Pew, MD 10/29/15 Laureen Abrahams

## 2015-10-28 NOTE — ED Notes (Signed)
Pt verbalizes understanding of d/c instructions and denies any further needs at this time. 

## 2015-10-28 NOTE — ED Notes (Signed)
PA at bedside.

## 2015-10-28 NOTE — ED Notes (Signed)
Pt c/o left inner calf pain, is able to point toes, but states he cannot flex his foot.  His achilles tendon is taut and not boggy.

## 2015-10-28 NOTE — ED Triage Notes (Signed)
Left calf injury.  Pt pulled his calf muscle last week and today while playing he felt a sharp pain and feels that he ripped his left calf muscle.

## 2015-10-28 NOTE — ED Provider Notes (Signed)
Medical screening examination/treatment/procedure(s) were conducted as a shared visit with non-physician practitioner(s) and myself.  I personally evaluated the patient during the encounter.  Was the bases earlier today in softball when he had an audible pop in his calf and severe pain in the back of his calf. On exam he has a positive Thompson's test however cannot locate the defect with ultrasound. Plan for Cam Walker and orthopedic follow-up for suspected partial Achilles rupture.  ULTRASOUND LIMITED SOFT TISSUE/ MUSCULOSKELETAL: left achilles Indication: audible pop, positive thompson test Linear probe used to evaluate area of interest in two planes. Findings:  No obvious rupture Performed by: Dr Dayna Barker Images saved electronically     Merrily Pew, MD 10/29/15 [phone]

## 2015-10-28 NOTE — ED Notes (Signed)
Pt returned from radiology via stretcher. Awaiting results.

## 2015-10-30 ENCOUNTER — Encounter: Payer: Self-pay | Admitting: Family Medicine

## 2015-10-30 ENCOUNTER — Ambulatory Visit (INDEPENDENT_AMBULATORY_CARE_PROVIDER_SITE_OTHER): Payer: Managed Care, Other (non HMO) | Admitting: Family Medicine

## 2015-10-30 DIAGNOSIS — S86812A Strain of other muscle(s) and tendon(s) at lower leg level, left leg, initial encounter: Secondary | ICD-10-CM

## 2015-10-30 NOTE — Patient Instructions (Signed)
You have a Grade 2 calf strain Compression sleeve or ace wrap to help with swelling and pain. Icing for 15 minutes at a time 3-4 times a day Heel lift in the boot when up and walking around to help prevent further strain. Crutches if needed Tylenol and/or aleve for pain (ok to take the medicines they gave you in the ER instead). Start ankle range of motion exercises (up/down and alphabet exercises). Follow up with me in 1 1/2 to 2 weeks.

## 2015-11-07 NOTE — Progress Notes (Signed)
PCP: Viviana Simpler, MD  Subjective:   HPI: Patient is a 60 y.o. male here for left calf pain.  Patient reports on 10/3 he was playing softball, was running and felt something snap in back of leg when running from first to second. Had a cramp here the week before but went away. Pain down to 4/10, dull, medial calf area. Has been stretching and did so before the game. Has been icing. No skin changes, numbness.  Past Medical History:  Diagnosis Date  . Anxiety   . BPH (benign prostatic hypertrophy)   . GERD (gastroesophageal reflux disease)   . Hypertension     Current Outpatient Prescriptions on File Prior to Visit  Medication Sig Dispense Refill  . cyclobenzaprine (FLEXERIL) 10 MG tablet Take 1 tablet (10 mg total) by mouth 2 (two) times daily as needed for muscle spasms. 20 tablet 0  . ibuprofen (ADVIL,MOTRIN) 800 MG tablet Take 1 tablet (800 mg total) by mouth 3 (three) times daily. 21 tablet 0  . Multiple Vitamin (MULTIVITAMIN PO) Take 1 tablet by mouth daily.      Marland Kitchen omeprazole (PRILOSEC) 20 MG capsule Take 20 mg by mouth daily.    Marland Kitchen oxyCODONE-acetaminophen (PERCOCET/ROXICET) 5-325 MG tablet Take 1 tablet by mouth every 4 (four) hours as needed for severe pain. May take 2 tablets PO q 6 hours for severe pain - Do not take with Tylenol as this tablet already contains tylenol 15 tablet 0  . sildenafil (REVATIO) 20 MG tablet TAKE 3-5 TABLETS BY MOUTH DAILY AS NEEDED 50 tablet 11  . traZODone (DESYREL) 150 MG tablet TAKE ONE TABLET BY MOUTH AT BEDTIME 30 tablet 11   No current facility-administered medications on file prior to visit.     Past Surgical History:  Procedure Laterality Date  . CORNEAL TRANSPLANT  2016  . DOBUTAMINE STRESS ECHO     negative    No Known Allergies  Social History   Social History  . Marital status: Legally Separated    Spouse name: N/A  . Number of children: 4  . Years of education: N/A   Occupational History  . sales for doors and  Occupational psychologist for [employer] History Main Topics  . Smoking status: Former Smoker    Types: Cigarettes  . Smokeless tobacco: Former Systems developer    Types: Chew    Quit date: 03/01/2011  . Alcohol use No  . Drug use: No  . Sexual activity: Not on file   Other Topics Concern  . Not on file   Social History Narrative   Divorced ~2007.   Remarried 4/15    Family History  Problem Relation Age of Onset  . Arthritis Sister   . Stroke Mother   . Diabetes Neg Hx   . Hypertension Neg Hx   . Heart disease Neg Hx   . Cancer Neg Hx     BP (!) 145/95   Pulse 69   Ht 5\' 10"  (1.778 m)   Wt 205 lb (93 kg)   BMI 29.41 kg/m   Review of Systems: See HPI above.    Objective:  Physical Exam:  Gen: NAD, comfortable in exam room  Left leg: No gross deformity, swelling, bruising, deformity. TTP medial gastroc.  No other tenderness. FROM ankle and knee.  Unable to do calf raise. NVI distally.  Right leg: FROM ankle without pain.    Assessment & Plan:  1. Left calf strain -  consistent with grade 2 strain.  Compression sleeve, icing, heel lifts.  Tylenol or nsaids as needed.  Shown ROMexercises to do daily.  F/u in 1 1/2 to 2 weeks for reevaluation.

## 2015-11-08 DIAGNOSIS — S86819A Strain of other muscle(s) and tendon(s) at lower leg level, unspecified leg, initial encounter: Secondary | ICD-10-CM | POA: Insufficient documentation

## 2015-11-08 NOTE — Assessment & Plan Note (Signed)
consistent with grade 2 strain.  Compression sleeve, icing, heel lifts.  Tylenol or nsaids as needed.  Shown ROMexercises to do daily.  F/u in 1 1/2 to 2 weeks for reevaluation.

## 2015-11-11 ENCOUNTER — Ambulatory Visit (INDEPENDENT_AMBULATORY_CARE_PROVIDER_SITE_OTHER): Payer: Managed Care, Other (non HMO) | Admitting: Family Medicine

## 2015-11-11 ENCOUNTER — Encounter: Payer: Self-pay | Admitting: Family Medicine

## 2015-11-11 DIAGNOSIS — S86812D Strain of other muscle(s) and tendon(s) at lower leg level, left leg, subsequent encounter: Secondary | ICD-10-CM | POA: Diagnosis not present

## 2015-11-11 NOTE — Patient Instructions (Signed)
You have a Grade 2 calf strain Compression sleeve or ace wrap to help with swelling and pain for next 4 weeks when up and walking around. Icing, tylenol, aleve only if needed. Transition out of the boot to a regular shoe with a heel lift. Start theraband strengthening exercises and two legged calf raises 3 sets of 10 once a day. When tolerated advance to one legged calf raises, finally to doing them on a step. I wouldn't do any sprinting, running until I see you back. Follow up with me in 4 weeks.

## 2015-11-14 NOTE — Assessment & Plan Note (Signed)
consistent with grade 2 strain.  Clinically improving - transition to regular shoe with heel lift.  Start theraband strengthening and calf raises.  F/u in 4 weeks.  Icing, tylenol or nsaids if needed.

## 2015-11-14 NOTE — Progress Notes (Signed)
PCP: Viviana Simpler, MD  Subjective:   HPI: Patient is a 60 y.o. male here for left calf pain.  10/5: Patient reports on 10/3 he was playing softball, was running and felt something snap in back of leg when running from first to second. Had a cramp here the week before but went away. Pain down to 4/10, dull, medial calf area. Has been stretching and did so before the game. Has been icing. No skin changes, numbness.  10/17: Patient reports he is improving. Using cam walker, icing, heel lifts in the boot. Occasionally taking ibuprofen. Doing ROM exercises. Pain level is 2/10, more dull. No skin changes, numbness.  Past Medical History:  Diagnosis Date  . Anxiety   . BPH (benign prostatic hypertrophy)   . GERD (gastroesophageal reflux disease)   . Hypertension     Current Outpatient Prescriptions on File Prior to Visit  Medication Sig Dispense Refill  . cyclobenzaprine (FLEXERIL) 10 MG tablet Take 1 tablet (10 mg total) by mouth 2 (two) times daily as needed for muscle spasms. 20 tablet 0  . ibuprofen (ADVIL,MOTRIN) 800 MG tablet Take 1 tablet (800 mg total) by mouth 3 (three) times daily. 21 tablet 0  . Multiple Vitamin (MULTIVITAMIN PO) Take 1 tablet by mouth daily.      Marland Kitchen omeprazole (PRILOSEC) 20 MG capsule Take 20 mg by mouth daily.    Marland Kitchen oxyCODONE-acetaminophen (PERCOCET/ROXICET) 5-325 MG tablet Take 1 tablet by mouth every 4 (four) hours as needed for severe pain. May take 2 tablets PO q 6 hours for severe pain - Do not take with Tylenol as this tablet already contains tylenol 15 tablet 0  . sildenafil (REVATIO) 20 MG tablet TAKE 3-5 TABLETS BY MOUTH DAILY AS NEEDED 50 tablet 11  . traZODone (DESYREL) 150 MG tablet TAKE ONE TABLET BY MOUTH AT BEDTIME 30 tablet 11   No current facility-administered medications on file prior to visit.     Past Surgical History:  Procedure Laterality Date  . CORNEAL TRANSPLANT  2016  . DOBUTAMINE STRESS ECHO     negative    No Known  Allergies  Social History   Social History  . Marital status: Legally Separated    Spouse name: N/A  . Number of children: 4  . Years of education: N/A   Occupational History  . 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 Smoking status: Former Smoker    Types: Cigarettes  . Smokeless tobacco: Former Systems developer    Types: Chew    Quit date: 03/01/2011  . Alcohol use No  . Drug use: No  . Sexual activity: Not on file   Other Topics Concern  . Not on file   Social History Narrative   Divorced ~2007.   Remarried 4/15    Family History  Problem Relation Age of Onset  . Arthritis Sister   . Stroke Mother   . Diabetes Neg Hx   . Hypertension Neg Hx   . Heart disease Neg Hx   . Cancer Neg Hx     BP (!) 137/95   Pulse 74   Ht 5\' 10"  (1.778 m)   Wt 205 lb (93 kg)   BMI 29.41 kg/m   Review of Systems: See HPI above.    Objective:  Physical Exam:  Gen: NAD, comfortable in exam room  Left leg: No gross deformity, swelling, bruising, deformity. Mild TTP medial gastroc.  No other tenderness. FROM ankle and knee.  Able to do 2 legged calf raise now. NVI distally.  Right leg: FROM ankle without pain.    Assessment & Plan:  1. Left calf strain - consistent with grade 2 strain.  Clinically improving - transition to regular shoe with heel lift.  Start theraband strengthening and calf raises.  F/u in 4 weeks.  Icing, tylenol or nsaids if needed.

## 2015-12-09 ENCOUNTER — Ambulatory Visit: Payer: PRIVATE HEALTH INSURANCE | Admitting: Family Medicine

## 2016-08-16 ENCOUNTER — Encounter: Payer: Managed Care, Other (non HMO) | Admitting: Internal Medicine

## 2016-08-30 ENCOUNTER — Encounter: Payer: Self-pay | Admitting: Internal Medicine

## 2016-08-30 ENCOUNTER — Ambulatory Visit (INDEPENDENT_AMBULATORY_CARE_PROVIDER_SITE_OTHER): Payer: Managed Care, Other (non HMO) | Admitting: Internal Medicine

## 2016-08-30 VITALS — BP 140/88 | HR 63 | Temp 98.2°F | Ht 70.0 in | Wt 210.0 lb

## 2016-08-30 DIAGNOSIS — I10 Essential (primary) hypertension: Secondary | ICD-10-CM

## 2016-08-30 DIAGNOSIS — Z0000 Encounter for general adult medical examination without abnormal findings: Secondary | ICD-10-CM

## 2016-08-30 DIAGNOSIS — R7301 Impaired fasting glucose: Secondary | ICD-10-CM | POA: Diagnosis not present

## 2016-08-30 DIAGNOSIS — K219 Gastro-esophageal reflux disease without esophagitis: Secondary | ICD-10-CM

## 2016-08-30 MED ORDER — SILDENAFIL CITRATE 20 MG PO TABS: ORAL_TABLET | ORAL | 11 refills | Status: DC

## 2016-08-30 NOTE — Assessment & Plan Note (Signed)
Okay on PPI

## 2016-08-30 NOTE — Patient Instructions (Signed)
DASH Eating Plan DASH stands for "Dietary Approaches to Stop Hypertension." The DASH eating plan is a healthy eating plan that has been shown to reduce high blood pressure (hypertension). It may also reduce your risk for type 2 diabetes, heart disease, and stroke. The DASH eating plan may also help with weight loss. What are tips for following this plan? General guidelines  Avoid eating more than 2,300 mg (milligrams) of salt (sodium) a day. If you have hypertension, you may need to reduce your sodium intake to 1,500 mg a day.  Limit alcohol intake to no more than 1 drink a day for nonpregnant women and 2 drinks a day for men. One drink equals 12 oz of beer, 5 oz of wine, or 1 oz of hard liquor.  Work with your health care provider to maintain a healthy body weight or to lose weight. Ask what an ideal weight is for you.  Get at least 30 minutes of exercise that causes your heart to beat faster (aerobic exercise) most days of the week. Activities may include walking, swimming, or biking.  Work with your health care provider or diet and nutrition specialist (dietitian) to adjust your eating plan to your individual calorie needs. Reading food labels  Check food labels for the amount of sodium per serving. Choose foods with less than 5 percent of the Daily Value of sodium. Generally, foods with less than 300 mg of sodium per serving fit into this eating plan.  To find whole grains, look for the word "whole" as the first word in the ingredient list. Shopping  Buy products labeled as "low-sodium" or "no salt added."  Buy fresh foods. Avoid canned foods and premade or frozen meals. Cooking  Avoid adding salt when cooking. Use salt-free seasonings or herbs instead of table salt or sea salt. Check with your health care provider or pharmacist before using salt substitutes.  Do not fry foods. Cook foods using healthy methods such as baking, boiling, grilling, and broiling instead.  Cook with  heart-healthy oils, such as olive, canola, soybean, or sunflower oil. Meal planning   Eat a balanced diet that includes: ? 5 or more servings of fruits and vegetables each day. At each meal, try to fill half of your plate with fruits and vegetables. ? Up to 6-8 servings of whole grains each day. ? Less than 6 oz of lean meat, poultry, or fish each day. A 3-oz serving of meat is about the same size as a deck of cards. One egg equals 1 oz. ? 2 servings of low-fat dairy each day. ? A serving of nuts, seeds, or beans 5 times each week. ? Heart-healthy fats. Healthy fats called Omega-3 fatty acids are found in foods such as flaxseeds and coldwater fish, like sardines, salmon, and mackerel.  Limit how much you eat of the following: ? Canned or prepackaged foods. ? Food that is high in trans fat, such as fried foods. ? Food that is high in saturated fat, such as fatty meat. ? Sweets, desserts, sugary drinks, and other foods with added sugar. ? Full-fat dairy products.  Do not salt foods before eating.  Try to eat at least 2 vegetarian meals each week.  Eat more home-cooked food and less restaurant, buffet, and fast food.  When eating at a restaurant, ask that your food be prepared with less salt or no salt, if possible. What foods are recommended? The items listed may not be a complete list. Talk with your dietitian about what   dietary choices are best for you. Grains Whole-grain or whole-wheat bread. Whole-grain or whole-wheat pasta. Brown rice. Oatmeal. Quinoa. Bulgur. Whole-grain and low-sodium cereals. Pita bread. Low-fat, low-sodium crackers. Whole-wheat flour tortillas. Vegetables Fresh or frozen vegetables (raw, steamed, roasted, or grilled). Low-sodium or reduced-sodium tomato and vegetable juice. Low-sodium or reduced-sodium tomato sauce and tomato paste. Low-sodium or reduced-sodium canned vegetables. Fruits All fresh, dried, or frozen fruit. Canned fruit in natural juice (without  added sugar). Meat and other protein foods Skinless chicken or turkey. Ground chicken or turkey. Pork with fat trimmed off. Fish and seafood. Egg whites. Dried beans, peas, or lentils. Unsalted nuts, nut butters, and seeds. Unsalted canned beans. Lean cuts of beef with fat trimmed off. Low-sodium, lean deli meat. Dairy Low-fat (1%) or fat-free (skim) milk. Fat-free, low-fat, or reduced-fat cheeses. Nonfat, low-sodium ricotta or cottage cheese. Low-fat or nonfat yogurt. Low-fat, low-sodium cheese. Fats and oils Soft margarine without trans fats. Vegetable oil. Low-fat, reduced-fat, or light mayonnaise and salad dressings (reduced-sodium). Canola, safflower, olive, soybean, and sunflower oils. Avocado. Seasoning and other foods Herbs. Spices. Seasoning mixes without salt. Unsalted popcorn and pretzels. Fat-free sweets. What foods are not recommended? The items listed may not be a complete list. Talk with your dietitian about what dietary choices are best for you. Grains Baked goods made with fat, such as croissants, muffins, or some breads. Dry pasta or rice meal packs. Vegetables Creamed or fried vegetables. Vegetables in a cheese sauce. Regular canned vegetables (not low-sodium or reduced-sodium). Regular canned tomato sauce and paste (not low-sodium or reduced-sodium). Regular tomato and vegetable juice (not low-sodium or reduced-sodium). Pickles. Olives. Fruits Canned fruit in a light or heavy syrup. Fried fruit. Fruit in cream or butter sauce. Meat and other protein foods Fatty cuts of meat. Ribs. Fried meat. Bacon. Sausage. Bologna and other processed lunch meats. Salami. Fatback. Hotdogs. Bratwurst. Salted nuts and seeds. Canned beans with added salt. Canned or smoked fish. Whole eggs or egg yolks. Chicken or turkey with skin. Dairy Whole or 2% milk, cream, and half-and-half. Whole or full-fat cream cheese. Whole-fat or sweetened yogurt. Full-fat cheese. Nondairy creamers. Whipped toppings.  Processed cheese and cheese spreads. Fats and oils Butter. Stick margarine. Lard. Shortening. Ghee. Bacon fat. Tropical oils, such as coconut, palm kernel, or palm oil. Seasoning and other foods Salted popcorn and pretzels. Onion salt, garlic salt, seasoned salt, table salt, and sea salt. Worcestershire sauce. Tartar sauce. Barbecue sauce. Teriyaki sauce. Soy sauce, including reduced-sodium. Steak sauce. Canned and packaged gravies. Fish sauce. Oyster sauce. Cocktail sauce. Horseradish that you find on the shelf. Ketchup. Mustard. Meat flavorings and tenderizers. Bouillon cubes. Hot sauce and Tabasco sauce. Premade or packaged marinades. Premade or packaged taco seasonings. Relishes. Regular salad dressings. Where to find more information:  National Heart, Lung, and Blood Institute: www.nhlbi.nih.gov  American Heart Association: www.heart.org Summary  The DASH eating plan is a healthy eating plan that has been shown to reduce high blood pressure (hypertension). It may also reduce your risk for type 2 diabetes, heart disease, and stroke.  With the DASH eating plan, you should limit salt (sodium) intake to 2,300 mg a day. If you have hypertension, you may need to reduce your sodium intake to 1,500 mg a day.  When on the DASH eating plan, aim to eat more fresh fruits and vegetables, whole grains, lean proteins, low-fat dairy, and heart-healthy fats.  Work with your health care provider or diet and nutrition specialist (dietitian) to adjust your eating plan to your individual   calorie needs. This information is not intended to replace advice given to you by your health care provider. Make sure you discuss any questions you have with your health care provider. Document Released: 12/31/2010 Document Revised: 01/05/2016 Document Reviewed: 01/05/2016 Elsevier Interactive Patient Education  2017 Elsevier Inc.  

## 2016-08-30 NOTE — Assessment & Plan Note (Signed)
BP Readings from Last 3 Encounters:  08/30/16 140/88  11/11/15 (!) 137/95  10/30/15 (!) 145/95   BP okay without Rx

## 2016-08-30 NOTE — Assessment & Plan Note (Signed)
Discussed lifestyle Will check A1c, CBC and chem panel

## 2016-08-30 NOTE — Progress Notes (Signed)
Subjective:    Patient ID: Gabriel Sullivan, male    DOB: 1955-10-08, 61 y.o.   MRN: [id number]  HPI Here for physical  Had injury of calf --which is not completely better Some spasm at times--- tore part of gastroc?  Now gets occasional medial thigh pain for a year or so---can feel heartbeat in it Mostly if he bends over without bending knees Sometimes if he picks up box and carries it at belt line also It helps to massage the muscle No leg weakness  Divorce went through Now in new relationship Some ED--- and sildenafil helped urinary symptoms as well  Current Outpatient Prescriptions on File Prior to Visit  Medication Sig Dispense Refill  . cyclobenzaprine (FLEXERIL) 10 MG tablet Take 1 tablet (10 mg total) by mouth 2 (two) times daily as needed for muscle spasms. 20 tablet 0  . ibuprofen (ADVIL,MOTRIN) 800 MG tablet Take 1 tablet (800 mg total) by mouth 3 (three) times daily. 21 tablet 0  . Multiple Vitamin (MULTIVITAMIN PO) Take 1 tablet by mouth daily.      Marland Kitchen omeprazole (PRILOSEC) 20 MG capsule Take 20 mg by mouth daily.    . sildenafil (REVATIO) 20 MG tablet TAKE 3-5 TABLETS BY MOUTH DAILY AS NEEDED 50 tablet 11   No current facility-administered medications on file prior to visit.     Not on File  Past Medical History:  Diagnosis Date  . Anxiety   . BPH (benign prostatic hypertrophy)   . GERD (gastroesophageal reflux disease)   . Hypertension     Past Surgical History:  Procedure Laterality Date  . CORNEAL TRANSPLANT  2016  . DOBUTAMINE STRESS ECHO     negative    Family History  Problem Relation Age of Onset  . Arthritis Sister   . Stroke Mother   . Diabetes Neg Hx   . Hypertension Neg Hx   . Heart disease Neg Hx   . Cancer Neg Hx     Social History   Social History  . Marital status: Divorced    Spouse name: N/A  . Number of children: 4  . Years of education: N/A   Occupational History  . sales for doors and Occupational psychologist  for [employer] History Main Topics  . Smoking status: Former Smoker    Types: Cigarettes  . Smokeless tobacco: Former Systems developer    Types: Chew    Quit date: 03/01/2011  . Alcohol use No  . Drug use: No  . Sexual activity: Not on file   Other Topics Concern  . Not on file   Social History Narrative   Divorced ~2007.   Remarried 4/15       Review of Systems  Constitutional: Negative for fatigue and unexpected weight change.       Wears seat belt  HENT: Positive for tinnitus. Negative for dental problem and trouble swallowing.        Hearing aides Keeps up with dentist  Eyes: Negative for redness and visual disturbance.       No diplopia or unilateral vision loss  Respiratory: Negative for cough, chest tightness and shortness of breath.   Cardiovascular: Negative for chest pain, palpitations and leg swelling.  Gastrointestinal: Negative for abdominal pain, blood in stool, constipation and nausea.       Uses OTC med for heartburn--controls  Endocrine: Negative for polydipsia and polyuria.  Genitourinary: Positive for difficulty urinating.  Stream is slow  Skin: Negative for rash.       No suspicious lesions  Allergic/Immunologic: Positive for environmental allergies. Negative for immunocompromised state.       Uses OTC regularly with success  Neurological: Positive for headaches. Negative for dizziness, syncope and light-headedness.  Hematological: Negative for adenopathy. Does not bruise/bleed easily.  Psychiatric/Behavioral: Negative for dysphoric mood and sleep disturbance. The patient is not nervous/anxious.        Objective:   Physical Exam  Constitutional: He is oriented to person, place, and time. He appears well-developed and well-nourished. No distress.  HENT:  Head: Normocephalic and atraumatic.  Right Ear: External ear normal.  Left Ear: External ear normal.  Mouth/Throat: Oropharynx is clear and moist. No oropharyngeal exudate.  Eyes: Pupils  are equal, round, and reactive to light. Conjunctivae are normal.  Neck: Normal range of motion. Neck supple. No thyromegaly present.  Cardiovascular: Normal rate, regular rhythm, normal heart sounds and intact distal pulses.  Exam reveals no gallop.   No murmur heard. Pulmonary/Chest: Effort normal and breath sounds normal. No respiratory distress. He has no wheezes. He has no rales.  Abdominal: Soft. He exhibits no distension. There is no tenderness. There is no rebound and no guarding.  Musculoskeletal: He exhibits no edema or tenderness.  Lymphadenopathy:    He has no cervical adenopathy.  Neurological: He is alert and oriented to person, place, and time.  Skin: No rash noted. No erythema.  Psychiatric: He has a normal mood and affect. His behavior is normal.          Assessment & Plan:

## 2016-08-30 NOTE — Assessment & Plan Note (Signed)
Healthy--discussed fitness Colon due 2020 Will defer PSA till at least next year Yearly flu vaccine

## 2016-08-31 LAB — CBC WITH DIFFERENTIAL/PLATELET
Basophils Absolute: 0.1 10*3/uL (ref 0.0–0.1)
Basophils Relative: 1.2 % (ref 0.0–3.0)
EOS PCT: 3.5 % (ref 0.0–5.0)
Eosinophils Absolute: 0.3 10*3/uL (ref 0.0–0.7)
HEMATOCRIT: 44.8 % (ref 39.0–52.0)
HEMOGLOBIN: 14.9 g/dL (ref 13.0–17.0)
Lymphocytes Relative: 25.7 % (ref 12.0–46.0)
Lymphs Abs: 2.4 10*3/uL (ref 0.7–4.0)
MCHC: 33.3 g/dL (ref 30.0–36.0)
MCV: 96.6 fl (ref 78.0–100.0)
MONOS PCT: 9.5 % (ref 3.0–12.0)
Monocytes Absolute: 0.9 10*3/uL (ref 0.1–1.0)
Neutro Abs: 5.6 10*3/uL (ref 1.4–7.7)
Neutrophils Relative %: 60.1 % (ref 43.0–77.0)
Platelets: 276 10*3/uL (ref 150.0–400.0)
RBC: 4.63 Mil/uL (ref 4.22–5.81)
RDW: 13.5 % (ref 11.5–15.5)
WBC: 9.3 10*3/uL (ref 4.0–10.5)

## 2016-08-31 LAB — LIPID PANEL
CHOL/HDL RATIO: 4
Cholesterol: 186 mg/dL (ref 0–200)
HDL: 48.8 mg/dL (ref >39.00)
LDL Cholesterol: 113 mg/dL — ABNORMAL HIGH (ref 0–99)
NONHDL: 137.57
Triglycerides: 121 mg/dL (ref 0.0–149.0)
VLDL: 24.2 mg/dL (ref 0.0–40.0)

## 2016-08-31 LAB — COMPREHENSIVE METABOLIC PANEL
ALBUMIN: 4.4 g/dL (ref 3.5–5.2)
ALK PHOS: 58 U/L (ref 39–117)
ALT: 18 U/L (ref 0–53)
AST: 18 U/L (ref 0–37)
BUN: 15 mg/dL (ref 6–23)
CO2: 30 mEq/L (ref 19–32)
Calcium: 9.7 mg/dL (ref 8.4–10.5)
Chloride: 101 mEq/L (ref 96–112)
Creatinine, Ser: 1.14 mg/dL (ref 0.40–1.50)
GFR: 69.91 mL/min (ref >60.00)
Glucose, Bld: 103 mg/dL — ABNORMAL HIGH (ref 70–99)
POTASSIUM: 4.9 meq/L (ref 3.5–5.1)
Sodium: 139 mEq/L (ref 135–145)
TOTAL PROTEIN: 7.6 g/dL (ref 6.0–8.3)
Total Bilirubin: 0.7 mg/dL (ref 0.2–1.2)

## 2016-08-31 LAB — HEMOGLOBIN A1C: Hgb A1c MFr Bld: 5.8 % (ref 4.6–6.5)

## 2017-06-24 ENCOUNTER — Other Ambulatory Visit: Payer: Self-pay | Admitting: Otolaryngology

## 2017-06-24 DIAGNOSIS — R519 Headache, unspecified: Secondary | ICD-10-CM

## 2017-06-24 DIAGNOSIS — H918X2 Other specified hearing loss, left ear: Secondary | ICD-10-CM

## 2017-06-24 DIAGNOSIS — IMO0001 Reserved for inherently not codable concepts without codable children: Secondary | ICD-10-CM

## 2017-06-24 DIAGNOSIS — R51 Headache: Secondary | ICD-10-CM

## 2017-06-30 ENCOUNTER — Ambulatory Visit
Admission: RE | Admit: 2017-06-30 | Discharge: 2017-06-30 | Disposition: A | Discharge status: Home / Self Care | Payer: PRIVATE HEALTH INSURANCE | Source: Ambulatory Visit | Attending: Otolaryngology | Admitting: Otolaryngology

## 2017-06-30 DIAGNOSIS — H918X2 Other specified hearing loss, left ear: Secondary | ICD-10-CM

## 2017-06-30 DIAGNOSIS — IMO0001 Reserved for inherently not codable concepts without codable children: Secondary | ICD-10-CM

## 2017-06-30 DIAGNOSIS — R51 Headache: Secondary | ICD-10-CM

## 2017-06-30 DIAGNOSIS — R519 Headache, unspecified: Secondary | ICD-10-CM

## 2017-07-11 ENCOUNTER — Other Ambulatory Visit: Payer: Self-pay | Admitting: Otolaryngology

## 2017-07-11 DIAGNOSIS — R519 Headache, unspecified: Secondary | ICD-10-CM

## 2017-07-11 DIAGNOSIS — H918X2 Other specified hearing loss, left ear: Secondary | ICD-10-CM

## 2017-07-11 DIAGNOSIS — R51 Headache: Secondary | ICD-10-CM

## 2017-07-11 DIAGNOSIS — IMO0001 Reserved for inherently not codable concepts without codable children: Secondary | ICD-10-CM

## 2017-07-20 ENCOUNTER — Ambulatory Visit
Admission: RE | Admit: 2017-07-20 | Discharge: 2017-07-20 | Disposition: A | Discharge status: Home / Self Care | Payer: PRIVATE HEALTH INSURANCE | Source: Ambulatory Visit | Attending: Otolaryngology | Admitting: Otolaryngology

## 2017-07-20 DIAGNOSIS — IMO0001 Reserved for inherently not codable concepts without codable children: Secondary | ICD-10-CM

## 2017-07-20 DIAGNOSIS — H918X2 Other specified hearing loss, left ear: Secondary | ICD-10-CM

## 2017-07-20 DIAGNOSIS — R51 Headache: Secondary | ICD-10-CM

## 2017-07-20 DIAGNOSIS — R519 Headache, unspecified: Secondary | ICD-10-CM

## 2017-07-20 MED ORDER — GADOBENATE DIMEGLUMINE 529 MG/ML IV SOLN
20.0000 mL | Freq: Once | INTRAVENOUS | Status: AC | PRN
  Administered 2017-07-20: 20 mL via INTRAVENOUS

## 2017-08-31 ENCOUNTER — Encounter: Payer: Self-pay | Admitting: Internal Medicine

## 2017-08-31 ENCOUNTER — Ambulatory Visit (INDEPENDENT_AMBULATORY_CARE_PROVIDER_SITE_OTHER): Payer: PRIVATE HEALTH INSURANCE | Admitting: Internal Medicine

## 2017-08-31 VITALS — BP 124/88 | HR 71 | Temp 98.4°F | Ht 70.0 in | Wt 211.0 lb

## 2017-08-31 DIAGNOSIS — Z125 Encounter for screening for malignant neoplasm of prostate: Secondary | ICD-10-CM | POA: Diagnosis not present

## 2017-08-31 DIAGNOSIS — Z0000 Encounter for general adult medical examination without abnormal findings: Secondary | ICD-10-CM | POA: Diagnosis not present

## 2017-08-31 DIAGNOSIS — I10 Essential (primary) hypertension: Secondary | ICD-10-CM | POA: Diagnosis not present

## 2017-08-31 DIAGNOSIS — K219 Gastro-esophageal reflux disease without esophagitis: Secondary | ICD-10-CM | POA: Diagnosis not present

## 2017-08-31 LAB — CBC
HEMATOCRIT: 45.8 % (ref 39.0–52.0)
HEMOGLOBIN: 15.5 g/dL (ref 13.0–17.0)
MCHC: 33.9 g/dL (ref 30.0–36.0)
MCV: 94.5 fl (ref 78.0–100.0)
Platelets: 255 10*3/uL (ref 150.0–400.0)
RBC: 4.85 Mil/uL (ref 4.22–5.81)
RDW: 13.2 % (ref 11.5–15.5)
WBC: 7.4 10*3/uL (ref 4.0–10.5)

## 2017-08-31 LAB — COMPREHENSIVE METABOLIC PANEL
ALT: 19 U/L (ref 0–53)
AST: 16 U/L (ref 0–37)
Albumin: 4.4 g/dL (ref 3.5–5.2)
Alkaline Phosphatase: 60 U/L (ref 39–117)
BUN: 15 mg/dL (ref 6–23)
CALCIUM: 10 mg/dL (ref 8.4–10.5)
CHLORIDE: 100 meq/L (ref 96–112)
CO2: 32 meq/L (ref 19–32)
CREATININE: 1.02 mg/dL (ref 0.40–1.50)
GFR: 79.22 mL/min (ref >60.00)
Glucose, Bld: 114 mg/dL — ABNORMAL HIGH (ref 70–99)
Potassium: 5.1 mEq/L (ref 3.5–5.1)
SODIUM: 138 meq/L (ref 135–145)
Total Bilirubin: 0.8 mg/dL (ref 0.2–1.2)
Total Protein: 7.4 g/dL (ref 6.0–8.3)

## 2017-08-31 LAB — PSA: PSA: 0.7 ng/mL (ref 0.10–4.00)

## 2017-08-31 NOTE — Progress Notes (Signed)
Subjective:    Patient ID: Gabriel Sullivan, male    DOB: 07-Jan-1956, 62 y.o.   MRN: [id number]  HPI Here for physical  Was evaluated for headaches and hearing problems MRI okay Some sinus symptoms---claritin and advil helps Hearing is stable Chronic tinnitus  Still on prilosec Controls the heartburn  Walks regularly No weight training--but looking into gym  Current Outpatient Medications on File Prior to Visit  Medication Sig Dispense Refill  . cyclobenzaprine (FLEXERIL) 10 MG tablet Take 1 tablet (10 mg total) by mouth 2 (two) times daily as needed for muscle spasms. 20 tablet 0  . ibuprofen (ADVIL,MOTRIN) 800 MG tablet Take 1 tablet (800 mg total) by mouth 3 (three) times daily. 21 tablet 0  . Multiple Vitamin (MULTIVITAMIN PO) Take 1 tablet by mouth daily.      Marland Kitchen omeprazole (PRILOSEC) 20 MG capsule Take 20 mg by mouth daily.    . sildenafil (REVATIO) 20 MG tablet TAKE 3-5 TABLETS BY MOUTH DAILY AS NEEDED 50 tablet 11   No current facility-administered medications on file prior to visit.     No Known Allergies  Past Medical History:  Diagnosis Date  . Anxiety   . BPH (benign prostatic hypertrophy)   . GERD (gastroesophageal reflux disease)   . Hypertension     Past Surgical History:  Procedure Laterality Date  . CORNEAL TRANSPLANT  2016  . DOBUTAMINE STRESS ECHO     negative    Family History  Problem Relation Age of Onset  . Arthritis Sister   . Stroke Mother   . COPD Brother   . Diabetes Neg Hx   . Hypertension Neg Hx   . Heart disease Neg Hx   . Cancer Neg Hx     Social History   Socioeconomic History  . Marital status: Divorced    Spouse name: Not on file  . Number of children: 4  . Years of education: Not on file  . Highest education level: Not on file  Occupational History  . 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    Comment: Managing department for [employer]  . Financial resource strain: Not on file  . Food insecurity:     Worry: Not on file    Inability: Not on file  . Transportation needs:    Medical: Not on file    Non-medical: Not on file  Tobacco Use  . Smoking status: Former Smoker    Types: Cigarettes  . Smokeless tobacco: Former Systems developer    Types: [place] date: 03/01/2011  Substance and Sexual Activity  . Alcohol use: No  . Drug use: No  . Sexual activity: Not on file  Lifestyle  . Physical activity:    Days per week: Not on file    Minutes per session: Not on file  . Stress: Not on file  Relationships  . Social connections:    Talks on phone: Not on file    Gets together: Not on file    Attends religious service: Not on file    Active member of club or organization: Not on file    Attends meetings of clubs or organizations: Not on file    Relationship status: Not on file  . Intimate partner violence:    Fear of current or ex partner: Not on file    Emotionally abused: Not on file    Physically abused: Not on file    Forced sexual activity: Not on  file  Other Topics Concern  . Not on file  Social History Narrative   Divorced twice   Review of Systems  Constitutional: Negative for fatigue and unexpected weight change.       Wears seat belt  HENT: Positive for hearing loss and tinnitus. Negative for dental problem and trouble swallowing.        Keeps up with dentist   Eyes: Negative for visual disturbance.       No diplopia or unilateral vision loss  Respiratory: Negative for cough and chest tightness.        Some DOE---out of shape  Cardiovascular: Negative for chest pain, palpitations and leg swelling.  Gastrointestinal: Negative for blood in stool and constipation.  Endocrine: Negative for polydipsia and polyuria.  Genitourinary: Negative for urgency.       Slow stream at times Nocturia sporadic  Musculoskeletal: Negative for back pain and joint swelling.       Chronic mild knee pain  Skin: Negative for rash.       No suspicious lesions  Allergic/Immunologic:  Positive for environmental allergies. Negative for immunocompromised state.  Neurological: Negative for syncope and light-headedness.       Some vertigo about a month ago--took 2 weeks to get better (would occur if looked up)  Hematological: Negative for adenopathy. Does not bruise/bleed easily.  Psychiatric/Behavioral: Negative for dysphoric mood. The patient is not nervous/anxious.        Mild sleep issues---awakening at 3AM. Things on his mind. Okay to try melatonin       Objective:   Physical Exam  Constitutional: He is oriented to person, place, and time. He appears well-developed. No distress.  HENT:  Head: Normocephalic and atraumatic.  Right Ear: External ear normal.  Left Ear: External ear normal.  Mouth/Throat: Oropharynx is clear and moist. No oropharyngeal exudate.  Eyes: Pupils are equal, round, and reactive to light. Conjunctivae are normal.  Neck: No thyromegaly present.  Cardiovascular: Normal rate, regular rhythm, normal heart sounds and intact distal pulses. Exam reveals no gallop.  No murmur heard. Respiratory: Effort normal and breath sounds normal. No respiratory distress. He has no wheezes. He has no rales.  GI: Soft. There is no tenderness.  Musculoskeletal: He exhibits no edema or tenderness.  Lymphadenopathy:    He has no cervical adenopathy.  Neurological: He is alert and oriented to person, place, and time.  Skin: No rash noted. No erythema.  Psychiatric: He has a normal mood and affect. His behavior is normal.           Assessment & Plan:

## 2017-08-31 NOTE — Assessment & Plan Note (Signed)
BP Readings from Last 3 Encounters:  08/31/17 124/88  08/30/16 140/88  11/11/15 (!) 137/95   Good control without meds

## 2017-08-31 NOTE — Assessment & Plan Note (Signed)
Healthy Discussed fitness Colon due 2/20 Discussed PSA--will check

## 2017-08-31 NOTE — Assessment & Plan Note (Signed)
Quiet on PPI 

## 2018-09-08 ENCOUNTER — Encounter: Payer: PRIVATE HEALTH INSURANCE | Admitting: Internal Medicine

## 2018-09-08 ENCOUNTER — Encounter: Payer: Self-pay | Admitting: Internal Medicine

## 2018-09-08 ENCOUNTER — Ambulatory Visit (INDEPENDENT_AMBULATORY_CARE_PROVIDER_SITE_OTHER): Payer: PRIVATE HEALTH INSURANCE | Admitting: Internal Medicine

## 2018-09-08 ENCOUNTER — Other Ambulatory Visit: Payer: Self-pay

## 2018-09-08 VITALS — BP 122/84 | HR 71 | Temp 98.5°F | Ht 70.0 in | Wt 215.0 lb

## 2018-09-08 DIAGNOSIS — K219 Gastro-esophageal reflux disease without esophagitis: Secondary | ICD-10-CM | POA: Diagnosis not present

## 2018-09-08 DIAGNOSIS — R7301 Impaired fasting glucose: Secondary | ICD-10-CM | POA: Diagnosis not present

## 2018-09-08 DIAGNOSIS — Z0000 Encounter for general adult medical examination without abnormal findings: Secondary | ICD-10-CM | POA: Diagnosis not present

## 2018-09-08 DIAGNOSIS — R7303 Prediabetes: Secondary | ICD-10-CM | POA: Insufficient documentation

## 2018-09-08 DIAGNOSIS — Z1211 Encounter for screening for malignant neoplasm of colon: Secondary | ICD-10-CM

## 2018-09-08 LAB — COMPREHENSIVE METABOLIC PANEL
ALT: 23 U/L (ref 0–53)
AST: 19 U/L (ref 0–37)
Albumin: 4.4 g/dL (ref 3.5–5.2)
Alkaline Phosphatase: 68 U/L (ref 39–117)
BUN: 13 mg/dL (ref 6–23)
CO2: 30 mEq/L (ref 19–32)
Calcium: 9.8 mg/dL (ref 8.4–10.5)
Chloride: 102 mEq/L (ref 96–112)
Creatinine, Ser: 1.11 mg/dL (ref 0.40–1.50)
GFR: 67.37 mL/min (ref >60.00)
Glucose, Bld: 124 mg/dL — ABNORMAL HIGH (ref 70–99)
Potassium: 5.5 mEq/L — ABNORMAL HIGH (ref 3.5–5.1)
Sodium: 139 mEq/L (ref 135–145)
Total Bilirubin: 0.5 mg/dL (ref 0.2–1.2)
Total Protein: 7.3 g/dL (ref 6.0–8.3)

## 2018-09-08 LAB — CBC
HCT: 45.8 % (ref 39.0–52.0)
Hemoglobin: 15.4 g/dL (ref 13.0–17.0)
MCHC: 33.6 g/dL (ref 30.0–36.0)
MCV: 95.2 fl (ref 78.0–100.0)
Platelets: 261 10*3/uL (ref 150.0–400.0)
RBC: 4.81 Mil/uL (ref 4.22–5.81)
RDW: 13.2 % (ref 11.5–15.5)
WBC: 7.5 10*3/uL (ref 4.0–10.5)

## 2018-09-08 LAB — HEMOGLOBIN A1C: Hgb A1c MFr Bld: 6.2 % (ref 4.6–6.5)

## 2018-09-08 NOTE — Assessment & Plan Note (Signed)
Healthy but needs to focus on fitness again Recommended flu vaccine Colon somewhat overdue Defer PSA

## 2018-09-08 NOTE — Assessment & Plan Note (Signed)
If elevated again, will start metformin

## 2018-09-08 NOTE — Progress Notes (Signed)
Subjective:    Patient ID: Gabriel Sullivan, male    DOB: May 07, 1955, 63 y.o.   MRN: [id number]  HPI Here for physical  He is doing okay Didn't miss any work--works in office of distribution center Had wedding planned for May--has postponed it  Not working out Does walk some Did gain a few pounds  Current Outpatient Medications on File Prior to Visit  Medication Sig Dispense Refill  . Multiple Vitamin (MULTIVITAMIN PO) Take 1 tablet by mouth daily.      Marland Kitchen omeprazole (PRILOSEC) 20 MG capsule Take 20 mg by mouth daily.    . sildenafil (REVATIO) 20 MG tablet TAKE 3-5 TABLETS BY MOUTH DAILY AS NEEDED 50 tablet 11   No current facility-administered medications on file prior to visit.     No Known Allergies  Past Medical History:  Diagnosis Date  . Anxiety   . BPH (benign prostatic hypertrophy)   . GERD (gastroesophageal reflux disease)   . Hypertension     Past Surgical History:  Procedure Laterality Date  . CORNEAL TRANSPLANT  2016  . DOBUTAMINE STRESS ECHO     negative    Family History  Problem Relation Age of Onset  . Arthritis Sister   . Stroke Mother   . COPD Brother   . Diabetes Neg Hx   . Hypertension Neg Hx   . Heart disease Neg Hx   . Cancer Neg Hx     Social History   Socioeconomic History  . Marital status: Divorced    Spouse name: Not on file  . Number of children: 4  . Years of education: Not on file  . Highest education level: Not on file  Occupational History  . 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    Comment: Managing department for [employer]  . Financial resource strain: Not on file  . Food insecurity    Worry: Not on file    Inability: Not on file  . Transportation needs    Medical: Not on file    Non-medical: Not on file  Tobacco Use  . Smoking status: Former Smoker    Types: Cigarettes  . Smokeless tobacco: Former Systems developer    Types: [place] date: 03/01/2011  Substance and Sexual Activity  . Alcohol use:  No  . Drug use: No  . Sexual activity: Not on file  Lifestyle  . Physical activity    Days per week: Not on file    Minutes per session: Not on file  . Stress: Not on file  Relationships  . Social Herbalist on phone: Not on file    Gets together: Not on file    Attends religious service: Not on file    Active member of club or organization: Not on file    Attends meetings of clubs or organizations: Not on file    Relationship status: Not on file  . Intimate partner violence    Fear of current or ex partner: Not on file    Emotionally abused: Not on file    Physically abused: Not on file    Forced sexual activity: Not on file  Other Topics Concern  . Not on file  Social History Narrative   Divorced twice   Review of Systems  Constitutional: Negative for fatigue.       Wears seat belt  HENT: Positive for tinnitus. Negative for dental problem and trouble swallowing.  Hearing aides Keeps up with dentist  Eyes: Negative for visual disturbance.       No diplopia or unilateral vision loss  Respiratory: Negative for cough, chest tightness and shortness of breath.   Cardiovascular: Negative for chest pain and leg swelling.       Slight "quivering" at times  Gastrointestinal: Negative for abdominal pain, blood in stool and constipation.       Heartburn is controlled  Endocrine: Negative for polydipsia and polyuria.  Genitourinary: Negative for urgency.       Mild flow issues No sexual problems  Musculoskeletal: Positive for back pain. Negative for arthralgias and joint swelling.  Skin: Negative for rash.  Allergic/Immunologic: Positive for environmental allergies. Negative for immunocompromised state.       Uses cetirizine in season  Neurological: Negative for dizziness, syncope, light-headedness and headaches.  Hematological: Negative for adenopathy. Does not bruise/bleed easily.  Psychiatric/Behavioral: Negative for dysphoric mood and sleep disturbance. The  patient is not nervous/anxious.        Objective:   Physical Exam  Constitutional: He is oriented to person, place, and time. He appears well-developed. No distress.  HENT:  Head: Normocephalic and atraumatic.  Right Ear: External ear normal.  Left Ear: External ear normal.  Mouth/Throat: Oropharynx is clear and moist. No oropharyngeal exudate.  Eyes: Pupils are equal, round, and reactive to light. Conjunctivae are normal.  Neck: No thyromegaly present.  Cardiovascular: Normal rate, regular rhythm, normal heart sounds and intact distal pulses. Exam reveals no gallop.  No murmur heard. Respiratory: Effort normal and breath sounds normal. No respiratory distress. He has no wheezes. He has no rales.  GI: Soft. There is no abdominal tenderness.  Musculoskeletal:        General: No tenderness or edema.  Lymphadenopathy:    He has no cervical adenopathy.  Neurological: He is alert and oriented to person, place, and time.  Skin: No rash noted. No erythema.  Psychiatric: He has a normal mood and affect. His behavior is normal.           Assessment & Plan:

## 2018-09-08 NOTE — Assessment & Plan Note (Signed)
Okay with PPI 

## 2018-09-14 ENCOUNTER — Telehealth: Payer: Self-pay

## 2018-09-14 MED ORDER — METFORMIN HCL 500 MG PO TABS: 500.0000 mg | ORAL_TABLET | Freq: Every day | ORAL | 3 refills | Status: DC

## 2018-09-14 NOTE — Telephone Encounter (Signed)
See lab results. Pt agreed to start metfromin 500mg  daily. Rx sent to pharmacy of his choice.

## 2018-09-18 ENCOUNTER — Encounter: Payer: Self-pay | Admitting: Gastroenterology

## 2018-10-09 ENCOUNTER — Other Ambulatory Visit: Payer: Self-pay

## 2018-10-09 ENCOUNTER — Ambulatory Visit (AMBULATORY_SURGERY_CENTER): Payer: Self-pay | Admitting: *Deleted

## 2018-10-09 VITALS — Temp 97.1°F | Ht 70.0 in | Wt 215.0 lb

## 2018-10-09 DIAGNOSIS — Z1211 Encounter for screening for malignant neoplasm of colon: Secondary | ICD-10-CM

## 2018-10-09 MED ORDER — PEG 3350-KCL-NA BICARB-NACL 420 G PO SOLR: 4000.0000 mL | Freq: Once | ORAL | 0 refills | Status: AC

## 2018-10-09 NOTE — Progress Notes (Signed)
Patient is here in-person for PV. Patient denies any allergies to eggs or soy. Patient denies any problems with anesthesia/sedation. Patient denies any oxygen use at home. Patient denies taking any diet/weight loss medications or blood thinners. Patient is not being treated for MRSA or C-diff. EMMI education assisgned to patient on colonoscopy, this was explained and instructions given to patient.   Pt is aware that care partner will wait in the car during procedure; if they feel like they will be too hot to wait in the car; they may wait in the lobby.  We want them to wear a mask (we do not have any that we can provide them), practice social distancing, and we will check their temperatures when they get here.  I did remind patient that their care partner needs to stay in the parking lot the entire time. Pt will wear mask into building.

## 2018-10-13 NOTE — Telephone Encounter (Signed)
Okay #10 x 11

## 2018-10-13 NOTE — Telephone Encounter (Signed)
He had sildenafil on his list. I removed it. How many Cialis 20mg  do you want to provide?

## 2018-10-16 MED ORDER — TADALAFIL 20 MG PO TABS: 20.0000 mg | ORAL_TABLET | Freq: Every day | ORAL | 11 refills | Status: DC | PRN

## 2018-10-20 ENCOUNTER — Telehealth: Payer: Self-pay | Admitting: Gastroenterology

## 2018-10-20 ENCOUNTER — Encounter: Payer: Self-pay | Admitting: Gastroenterology

## 2018-10-20 NOTE — Telephone Encounter (Signed)
Pt answered  "NO" to all covid questions. °

## 2018-10-20 NOTE — Telephone Encounter (Signed)

## 2018-10-23 ENCOUNTER — Ambulatory Visit (AMBULATORY_SURGERY_CENTER): Payer: PRIVATE HEALTH INSURANCE | Admitting: Gastroenterology

## 2018-10-23 ENCOUNTER — Other Ambulatory Visit: Payer: Self-pay

## 2018-10-23 ENCOUNTER — Encounter: Payer: Self-pay | Admitting: Gastroenterology

## 2018-10-23 VITALS — BP 139/92 | HR 74 | Temp 98.5°F | Resp 14 | Ht 70.0 in | Wt 215.0 lb

## 2018-10-23 DIAGNOSIS — D129 Benign neoplasm of anus and anal canal: Secondary | ICD-10-CM

## 2018-10-23 DIAGNOSIS — D128 Benign neoplasm of rectum: Secondary | ICD-10-CM | POA: Diagnosis not present

## 2018-10-23 DIAGNOSIS — Z1211 Encounter for screening for malignant neoplasm of colon: Secondary | ICD-10-CM

## 2018-10-23 DIAGNOSIS — D122 Benign neoplasm of ascending colon: Secondary | ICD-10-CM

## 2018-10-23 MED ORDER — SODIUM CHLORIDE 0.9 % IV SOLN
500.0000 mL | Freq: Once | INTRAVENOUS | Status: DC
Start: 2018-10-23 — End: 2018-10-23

## 2018-10-23 NOTE — Op Note (Signed)
[facility] Patient Name: Gabriel Sullivan Procedure Date: 10/23/2018 8:45 AM MRN: [id number] Endoscopist: Milus Banister , MD Age: 63 Referring MD:  Date of Birth: Jun 06, 1955 Gender: Male Account #: [id number] Procedure:                Colonoscopy Indications:              Screening for colorectal malignant neoplasm Medicines:                Monitored Anesthesia Care Procedure:                Pre-Anesthesia Assessment:                           - Prior to the procedure, a History and Physical                            was performed, and patient medications and                            allergies were reviewed. The patient's tolerance of                            previous anesthesia was also reviewed. The risks                            and benefits of the procedure and the sedation                            options and risks were discussed with the patient.                            All questions were answered, and informed consent                            was obtained. Prior Anticoagulants: The patient has                            taken no previous anticoagulant or antiplatelet                            agents. ASA Grade Assessment: II - A patient with                            mild systemic disease. After reviewing the risks                            and benefits, the patient was deemed in                            satisfactory condition to undergo the procedure.                           After obtaining informed consent, the colonoscope  was passed under direct vision. Throughout the                            procedure, the patient's blood pressure, pulse, and                            oxygen saturations were monitored continuously. The                            Colonoscope was introduced through the anus and                            advanced to the the cecum, identified by                            appendiceal orifice and  ileocecal valve. The                            colonoscopy was performed without difficulty. The                            patient tolerated the procedure well. The quality                            of the bowel preparation was excellent. Scope In: 8:48:40 AM Scope Out: 8:58:21 AM Scope Withdrawal Time: 0 hours 6 minutes 29 seconds  Total Procedure Duration: 0 hours 9 minutes 41 seconds  Findings:                 A 1 mm polyp was found in the ascending colon. The                            polyp was sessile. The polyp was removed with a                            cold biopsy forceps. Resection and retrieval were                            complete.                           A 5 mm polyp was found in the rectum. The polyp was                            sessile. The polyp was removed with a cold snare.                            Resection and retrieval were complete.                           The exam was otherwise without abnormality on                            direct and retroflexion views.  Complications:            No immediate complications. Estimated blood loss:                            None. Estimated Blood Loss:     Estimated blood loss: none. Impression:               - One 1 mm polyp in the ascending colon, removed                            with a cold biopsy forceps. Resected and retrieved.                           - One 5 mm polyp in the rectum, removed with a cold                            snare. Resected and retrieved.                           - The examination was otherwise normal on direct                            and retroflexion views. Recommendation:           - Patient has a contact number available for                            emergencies. The signs and symptoms of potential                            delayed complications were discussed with the                            patient. Return to normal activities tomorrow.                            Written  discharge instructions were provided to the                            patient.                           - Resume previous diet.                           - Continue present medications.                           - Await pathology results. Likely repeat                            colonoscoppy in 7-10 years. Milus Banister, MD 10/23/2018 9:01:24 AM This report has been signed electronically.

## 2018-10-23 NOTE — Progress Notes (Signed)
Called to room to assist during endoscopic procedure.  Patient ID and intended procedure confirmed with present staff. Received instructions for my participation in the procedure from the performing physician.  

## 2018-10-23 NOTE — Progress Notes (Signed)
To PACU< VSS. Report to [email or web address].tb 

## 2018-10-23 NOTE — Progress Notes (Signed)
Pt's states no medical or surgical changes since previsit or office visit.  Temp KA Vitals CW 

## 2018-10-23 NOTE — Patient Instructions (Signed)
Read all handouts given to you by your recovery room nurse.  Thank-you for choosing us for your healthcare needs today.  YOU HAD AN ENDOSCOPIC PROCEDURE TODAY AT THE [facility]:   Refer to the procedure report that was given to you for any specific questions about what was found during the examination.  If the procedure report does not answer your questions, please call your gastroenterologist to clarify.  If you requested that your care partner not be given the details of your procedure findings, then the procedure report has been included in a sealed envelope for you to review at your convenience later.  YOU SHOULD EXPECT: Some feelings of bloating in the abdomen. Passage of more gas than usual.  Walking can help get rid of the air that was put into your GI tract during the procedure and reduce the bloating. If you had a lower endoscopy (such as a colonoscopy or flexible sigmoidoscopy) you may notice spotting of blood in your stool or on the toilet paper. If you underwent a bowel prep for your procedure, you may not have a normal bowel movement for a few days.  Please Note:  You might notice some irritation and congestion in your nose or some drainage.  This is from the oxygen used during your procedure.  There is no need for concern and it should clear up in a day or so.  SYMPTOMS TO REPORT IMMEDIATELY:   Following lower endoscopy (colonoscopy or flexible sigmoidoscopy):  Excessive amounts of blood in the stool  Significant tenderness or worsening of abdominal pains  Swelling of the abdomen that is new, acute  Fever of 100F or higher   For urgent or emergent issues, a gastroenterologist can be reached at any hour by calling [phone].   DIET:  We do recommend a small meal at first, but then you may proceed to your regular diet.  Drink plenty of fluids but you should avoid alcoholic beverages for 24 hours.  ACTIVITY:  You should plan to take it easy for the rest of today  and you should NOT DRIVE or use heavy machinery until tomorrow (because of the sedation medicines used during the test).    FOLLOW UP: Our staff will call the number listed on your records 48-72 hours following your procedure to check on you and address any questions or concerns that you may have regarding the information given to you following your procedure. If we do not reach you, we will leave a message.  We will attempt to reach you two times.  During this call, we will ask if you have developed any symptoms of COVID 19. If you develop any symptoms (ie: fever, flu-like symptoms, shortness of breath, cough etc.) before then, please call [phone].  If you test positive for Covid 19 in the 2 weeks post procedure, please call and report this information to us.    If any biopsies were taken you will be contacted by phone or by letter within the next 1-3 weeks.  Please call us at [phone] if you have not heard about the biopsies in 3 weeks.    SIGNATURES/CONFIDENTIALITY: You and/or your care partner have signed paperwork which will be entered into your electronic medical record.  These signatures attest to the fact that that the information above on your After Visit Summary has been reviewed and is understood.  Full responsibility of the confidentiality of this discharge information lies with you and/or your care-partner. 

## 2018-10-25 ENCOUNTER — Telehealth: Payer: Self-pay

## 2018-10-25 ENCOUNTER — Telehealth: Payer: Self-pay | Admitting: *Deleted

## 2018-10-25 NOTE — Telephone Encounter (Signed)
No answer, message left for the patient. 

## 2018-10-25 NOTE — Telephone Encounter (Signed)
  Follow up Call-  Call back number 10/23/2018  Post procedure Call Back phone  # [phone]  Permission to leave phone message Yes  Some recent data might be hidden     Patient questions:  Do you have a fever, pain , or abdominal swelling? No. Pain Score  0 *  Have you tolerated food without any problems? Yes.    Have you been able to return to your normal activities? Yes.    Do you have any questions about your discharge instructions: Diet   No. Medications  No. Follow up visit  No.  Do you have questions or concerns about your Care? No.  Actions: * If pain score is 4 or above: No action needed, pain <4.  1. Have you developed a fever since your procedure? no  2.   Have you had an respiratory symptoms (SOB or cough) since your procedure? no  3.   Have you tested positive for COVID 19 since your procedure no  4.   Have you had any family members/close contacts diagnosed with the COVID 19 since your procedure?  no   If yes to any of these questions please route to Joylene John, RN and Alphonsa Gin, Therapist, sports.

## 2018-10-27 ENCOUNTER — Encounter: Payer: Self-pay | Admitting: Gastroenterology

## 2018-12-14 ENCOUNTER — Ambulatory Visit (INDEPENDENT_AMBULATORY_CARE_PROVIDER_SITE_OTHER): Payer: PRIVATE HEALTH INSURANCE | Admitting: Family Medicine

## 2018-12-14 ENCOUNTER — Encounter: Payer: Self-pay | Admitting: Family Medicine

## 2018-12-14 ENCOUNTER — Telehealth: Payer: Self-pay

## 2018-12-14 VITALS — BP 166/116 | HR 67 | Temp 96.6°F | Ht 70.0 in | Wt 210.0 lb

## 2018-12-14 DIAGNOSIS — Z20828 Contact with and (suspected) exposure to other viral communicable diseases: Secondary | ICD-10-CM | POA: Diagnosis not present

## 2018-12-14 DIAGNOSIS — Z20822 Contact with and (suspected) exposure to covid-19: Secondary | ICD-10-CM

## 2018-12-14 NOTE — Telephone Encounter (Signed)
See OV  

## 2018-12-14 NOTE — Progress Notes (Signed)
     Gabriel Sullivan T. Vayda [name], MD Primary Care and [place] at [facility] [place], [address] Phone: [phone]  FAX: [place] - 63 y.o. male  MRN [id number]  Date of Birth: Jun 18, 1955  Visit Date: 12/14/2018  PCP: Venia Carbon, MD  Referred by: Venia Carbon, MD Chief Complaint  Patient presents with  . Covid Exposure   Virtual Visit via Video Note:  I connected with  Gabriel Sullivan on 12/14/2018  2:00 PM EST by a video enabled telemedicine application and verified that I am speaking with the correct person using two identifiers.   Location patient: home computer, tablet, or smartphone Location provider: work or home office Consent: Verbal consent directly obtained from [name]. Persons participating in the virtual visit: patient, provider  I discussed the limitations of evaluation and management by telemedicine and the availability of in person appointments. The patient expressed understanding and agreed to proceed.  History of Present Illness:  Very pleasant patient, and he presents with a known exposure to COVID-19 and his wife.  She came to the testing center on December 06, 2018, and she was tested positive and received her result on December 08, 2018.  We discussed how we should handle this, and his work will require a negative Covid test.  Looking at the upcoming holiday, we decided and I recommended that he do this next Tuesday.  Prior to Thanksgiving.  Review of Systems as above: See pertinent positives and pertinent negatives per HPI No acute distress verbally  Past Medical History, Surgical History, Social History, Family History, Problem List, Medications, and Allergies have been reviewed and updated if relevant.   Observations/Objective/Exam:  An attempt was made to discern vital signs over the phone and per patient if applicable and possible.   General:    Alert, Oriented,  appears well and in no acute distress HEENT:     Atraumatic, conjunctiva clear, no obvious abnormalities on inspection of external nose and ears.  Neck:    Normal movements of the head and neck Pulmonary:     On inspection no signs of respiratory distress, breathing rate appears normal, no obvious gross SOB, gasping or wheezing Cardiovascular:    No obvious cyanosis Musculoskeletal:    Moves all visible extremities without noticeable abnormality Psych / Neurological:     Pleasant and cooperative, no obvious depression or anxiety, speech and thought processing grossly intact  Assessment and Plan:    ICD-10-CM   1. Exposure to COVID-19 virus  Z20.828    Plan as above.  I discussed the assessment and treatment plan with the patient. The patient was provided an opportunity to ask questions and all were answered. The patient agreed with the plan and demonstrated an understanding of the instructions.   The patient was advised to call back or seek an in-person evaluation if the symptoms worsen or if the condition fails to improve as anticipated.  Follow-up: prn unless noted otherwise below No follow-ups on file.  No orders of the defined types were placed in this encounter.  No orders of the defined types were placed in this encounter.   Signed,  [name]. Loreda Silverio, MD

## 2018-12-14 NOTE — Telephone Encounter (Signed)
I spoke with Gabriel Sullivan; Gabriel Sullivan said his daughter tested positive for covid on 12/05/18 and Gabriel Sullivan saw his daughter without mask or social distancing on 12/02/18. pts fiancee whom Gabriel Sullivan lives with (no mask or social distancing) tested positive for covid virus on 12/10/18. Gabriel Sullivan is self quarantining and has no covid symptoms and has not traveled. Gabriel Sullivan wants to know if he needs testing and if so when. Gabriel Sullivan scheduled virtual visit for today at 2 PM with Dr Lorelei Pont. Gabriel Sullivan will have vital signs ready when called.

## 2018-12-18 ENCOUNTER — Other Ambulatory Visit: Payer: Self-pay

## 2018-12-18 DIAGNOSIS — Z20822 Contact with and (suspected) exposure to covid-19: Secondary | ICD-10-CM

## 2018-12-20 LAB — NOVEL CORONAVIRUS, NAA: SARS-CoV-2, NAA: NOT DETECTED

## 2019-03-10 ENCOUNTER — Other Ambulatory Visit: Payer: Self-pay

## 2019-03-10 ENCOUNTER — Emergency Department (HOSPITAL_COMMUNITY)
Admission: EM | Admit: 2019-03-10 | Discharge: 2019-03-10 | Disposition: A | Discharge status: Home / Self Care | Payer: PRIVATE HEALTH INSURANCE | Attending: Emergency Medicine | Admitting: Emergency Medicine

## 2019-03-10 ENCOUNTER — Encounter (HOSPITAL_COMMUNITY): Payer: Self-pay | Admitting: Emergency Medicine

## 2019-03-10 ENCOUNTER — Emergency Department (HOSPITAL_COMMUNITY): Payer: PRIVATE HEALTH INSURANCE

## 2019-03-10 DIAGNOSIS — E119 Type 2 diabetes mellitus without complications: Secondary | ICD-10-CM | POA: Insufficient documentation

## 2019-03-10 DIAGNOSIS — R0789 Other chest pain: Secondary | ICD-10-CM | POA: Diagnosis present

## 2019-03-10 DIAGNOSIS — Z79899 Other long term (current) drug therapy: Secondary | ICD-10-CM | POA: Diagnosis not present

## 2019-03-10 DIAGNOSIS — Z7984 Long term (current) use of oral hypoglycemic drugs: Secondary | ICD-10-CM | POA: Insufficient documentation

## 2019-03-10 DIAGNOSIS — Z87891 Personal history of nicotine dependence: Secondary | ICD-10-CM | POA: Diagnosis not present

## 2019-03-10 DIAGNOSIS — I10 Essential (primary) hypertension: Secondary | ICD-10-CM | POA: Insufficient documentation

## 2019-03-10 LAB — COMPREHENSIVE METABOLIC PANEL
ALT: 20 U/L (ref 0–44)
AST: 17 U/L (ref 15–41)
Albumin: 3.7 g/dL (ref 3.5–5.0)
Alkaline Phosphatase: 62 U/L (ref 38–126)
Anion gap: 11 (ref 5–15)
BUN: 11 mg/dL (ref 8–23)
CO2: 26 mmol/L (ref 22–32)
Calcium: 9.5 mg/dL (ref 8.9–10.3)
Chloride: 102 mmol/L (ref 98–111)
Creatinine, Ser: 1.14 mg/dL (ref 0.61–1.24)
GFR calc Af Amer: >60 mL/min (ref >60)
GFR calc non Af Amer: >60 mL/min (ref >60)
Glucose, Bld: 118 mg/dL — ABNORMAL HIGH (ref 70–99)
Potassium: 3.9 mmol/L (ref 3.5–5.1)
Sodium: 139 mmol/L (ref 135–145)
Total Bilirubin: 0.6 mg/dL (ref 0.3–1.2)
Total Protein: 7.9 g/dL (ref 6.5–8.1)

## 2019-03-10 LAB — CBC WITH DIFFERENTIAL/PLATELET
Abs Immature Granulocytes: 0.02 10*3/uL (ref 0.00–0.07)
Basophils Absolute: 0 10*3/uL (ref 0.0–0.1)
Basophils Relative: 0 %
Eosinophils Absolute: 0.3 10*3/uL (ref 0.0–0.5)
Eosinophils Relative: 3 %
HCT: 46 % (ref 39.0–52.0)
Hemoglobin: 15.4 g/dL (ref 13.0–17.0)
Immature Granulocytes: 0 %
Lymphocytes Relative: 18 %
Lymphs Abs: 2 10*3/uL (ref 0.7–4.0)
MCH: 31.8 pg (ref 26.0–34.0)
MCHC: 33.5 g/dL (ref 30.0–36.0)
MCV: 95 fL (ref 80.0–100.0)
Monocytes Absolute: 0.9 10*3/uL (ref 0.1–1.0)
Monocytes Relative: 9 %
Neutro Abs: 7.6 10*3/uL (ref 1.7–7.7)
Neutrophils Relative %: 70 %
Platelets: 282 10*3/uL (ref 150–400)
RBC: 4.84 MIL/uL (ref 4.22–5.81)
RDW: 12.3 % (ref 11.5–15.5)
WBC: 10.9 10*3/uL — ABNORMAL HIGH (ref 4.0–10.5)
nRBC: 0 % (ref 0.0–0.2)

## 2019-03-10 LAB — TROPONIN I (HIGH SENSITIVITY)
Troponin I (High Sensitivity): 3 ng/L (ref <18)
Troponin I (High Sensitivity): <2 ng/L (ref <18)

## 2019-03-10 MED ORDER — IOHEXOL 350 MG/ML SOLN
100.0000 mL | Freq: Once | INTRAVENOUS | Status: AC | PRN
  Administered 2019-03-10: 70 mL via INTRAVENOUS

## 2019-03-10 MED ORDER — KETOROLAC TROMETHAMINE 30 MG/ML IJ SOLN
30.0000 mg | Freq: Once | INTRAMUSCULAR | Status: AC
  Administered 2019-03-10: 30 mg via INTRAVENOUS
  Filled 2019-03-10: qty 1

## 2019-03-10 MED ORDER — ACETAMINOPHEN 325 MG PO TABS
650.0000 mg | ORAL_TABLET | Freq: Once | ORAL | Status: AC
  Administered 2019-03-10: 650 mg via ORAL
  Filled 2019-03-10: qty 2

## 2019-03-10 NOTE — Discharge Instructions (Addendum)
It is important for you to follow-up with the pulmonologist listed below for further evaluation and management of your chronic lung disease that we found on your CT scan today. If you ever start to experience sudden onset of chest pain or shortness of breath you need to be evaluated in the ER. Follow-up with your primary care provider as well. Return to the ED if your symptoms start to worsen, you have leg swelling, blurry vision, numbness in arms or legs.

## 2019-03-10 NOTE — ED Provider Notes (Signed)
Gabriel Sullivan   CSN: [id number] Arrival date & time: 03/10/19  1451     History Chief Complaint  Patient presents with   Chest Pain    Gabriel Sullivan is a 64 y.o. male with a past medical history of GERD, hypertension, diabetes presenting to the ED with a chief complaint of chest pain and shortness of breath.  Starting on 03/07/2019 patient had aching mid back pain radiating to between his shoulder blades.  Then started radiating to his chest and he has had an intermittent pressure/squeezing sensation throughout his chest since yesterday.  His pain did improve with a dose of tramadol yesterday and he was able to sleep.  Pain returned when he woke up this morning and has been intermittent since then.  States that the pain is worse when he takes in a deep breath and he has been feeling short of breath when the pain worsens.  He was diagnosed with diabetes in August 2020 and told to take Metformin but he has not taken this since it was prescribed due to fear of side effects and wanting to control his blood sugar with diet.  He was also diagnosed with high blood pressure but was not prescribed any antihypertensives.  States that this feels different than his usual reflux which usually improves with his daily Prilosec.  Reports nausea but denies any vomiting.  Has not had a cough.  Has not been diagnosed with Covid.  No sick contacts with similar symptoms.  Denies any leg swelling, recent immobilization, chronic lung disease, tobacco use, abdominal pain, injuries or falls or fever.  HPI     Past Medical History:  Diagnosis Date   Anxiety    BPH (benign prostatic hypertrophy)    GERD (gastroesophageal reflux disease)    Hypertension     Patient Active Problem List   Diagnosis Date Noted   Elevated fasting glucose 09/08/2018   BPH (benign prostatic hypertrophy)    Erectile dysfunction 05/24/2012   Routine general medical examination  at a health care facility 03/29/2011   Sleep disturbance 12/03/2008   GERD 05/22/2008   Essential hypertension, benign 01/11/2007    Past Surgical History:  Procedure Laterality Date   COLONOSCOPY  03/01/2008   "tortuous" , TIC's   CORNEAL TRANSPLANT  2016   DOBUTAMINE STRESS ECHO  2009   negative       Family History  Problem Relation Age of Onset   Arthritis Sister    Stroke Mother    COPD Brother    Diabetes Neg Hx    Hypertension Neg Hx    Heart disease Neg Hx    Cancer Neg Hx    Colon cancer Neg Hx    Colon polyps Neg Hx    Esophageal cancer Neg Hx    Stomach cancer Neg Hx    Rectal cancer Neg Hx     Social History   Tobacco Use   Smoking status: Former Smoker    Types: Cigarettes   Smokeless tobacco: Former Systems developer    Types: Chew    Quit date: 03/01/2011  Substance Use Topics   Alcohol use: Yes    Comment: occ   Drug use: No    Home Medications Prior to Admission medications   Medication Sig Start Date End Date Taking? Authorizing Provider  Loratadine (CLARITIN PO) Take 1 tablet by mouth daily.   Yes [provider]  Multiple Vitamin (MULTIVITAMIN PO) Take 1 tablet by mouth daily.  Yes [provider]  Omega-3 Fatty Acids (FISH OIL PO) Take 1 capsule by mouth daily.   Yes [provider]  OVER THE COUNTER MEDICATION Take 1 tablet by mouth daily. For acid reflux   Yes [provider]  metFORMIN (GLUCOPHAGE) 500 MG tablet Take 1 tablet (500 mg total) by mouth daily with breakfast. Patient not taking: Reported on 03/10/2019 09/14/18   Venia Carbon, MD  tadalafil (CIALIS) 20 MG tablet Take 1 tablet (20 mg total) by mouth daily as needed. Patient not taking: Reported on 03/10/2019 10/16/18   Venia Carbon, MD    Allergies    Patient has no known allergies.  Review of Systems   Review of Systems  Constitutional: Negative for appetite change, chills and fever.  HENT: Negative for ear pain,  rhinorrhea, sneezing and sore throat.   Eyes: Negative for photophobia and visual disturbance.  Respiratory: Positive for shortness of breath. Negative for cough, chest tightness and wheezing.   Cardiovascular: Positive for chest pain. Negative for palpitations.  Gastrointestinal: Negative for abdominal pain, blood in stool, constipation, diarrhea, nausea and vomiting.  Genitourinary: Negative for dysuria, hematuria and urgency.  Musculoskeletal: Negative for myalgias.  Skin: Negative for rash.  Neurological: Negative for dizziness, weakness and light-headedness.    Physical Exam Updated Vital Signs BP (!) 150/80    Pulse 83    Temp 98.8 F (37.1 C)    Resp 18    Ht 5\' 10"  (1.778 m)    Wt 96.6 kg    SpO2 94%    BMI 30.56 kg/m   Physical Exam Vitals and nursing Sullivan reviewed.  Constitutional:      General: He is not in acute distress.    Appearance: He is well-developed.  HENT:     Head: Normocephalic and atraumatic.     Nose: Nose normal.  Eyes:     General: No scleral icterus.       Left eye: No discharge.     Conjunctiva/sclera: Conjunctivae normal.  Cardiovascular:     Rate and Rhythm: Normal rate and regular rhythm.     Heart sounds: Normal heart sounds. No murmur. No friction rub. No gallop.   Pulmonary:     Effort: Pulmonary effort is normal. Tachypnea present. No respiratory distress.     Breath sounds: Normal breath sounds.  Abdominal:     General: Bowel sounds are normal. There is no distension.     Palpations: Abdomen is soft.     Tenderness: There is no abdominal tenderness. There is no guarding.  Musculoskeletal:        General: Normal range of motion.     Cervical back: Normal range of motion and neck supple.     Right lower leg: No tenderness.     Left lower leg: No tenderness.  Skin:    General: Skin is warm and dry.     Findings: No rash.  Neurological:     Mental Status: He is alert.     Motor: No abnormal muscle tone.     Coordination: Coordination  normal.     ED Results / Procedures / Treatments   Labs (all labs ordered are listed, but only abnormal results are displayed) Labs Reviewed  CBC WITH DIFFERENTIAL/PLATELET - Abnormal; Notable for the following components:      Result Value   WBC 10.9 (*)    All other components within normal limits  COMPREHENSIVE METABOLIC PANEL - Abnormal; Notable for the following components:  Glucose, Bld 118 (*)    All other components within normal limits  TROPONIN I (HIGH SENSITIVITY)  TROPONIN I (HIGH SENSITIVITY)    EKG EKG Interpretation  Date/Time:  Saturday March 10 2019 14:58:55 EST Ventricular Rate:  77 PR Interval:    QRS Duration: 91 QT Interval:  368 QTC Calculation: 417 R Axis:   18 Text Interpretation: Sinus rhythm Probable left atrial enlargement Low voltage, precordial leads Abnormal R-wave progression, early transition No previous ECGs available Confirmed by Theotis Burrow [phone]) on 03/10/2019 7:24:47 PM   Radiology DG Chest 2 View  Result Date: 03/10/2019 CLINICAL DATA:  Chest pain and back pain. EXAM: CHEST - 2 VIEW COMPARISON:  Chest radiograph 11/28/2012 FINDINGS: Stable cardiomediastinal contours. Low lung volumes with bronchovascular crowding. No focal consolidation. No pneumothorax or pleural effusion. No acute finding in the visualized skeleton. IMPRESSION: No acute cardiopulmonary finding. Low lung volumes with bronchovascular crowding. Electronically Signed   By: Audie Pinto M.D.   On: 03/10/2019 15:45   CT Angio Chest PE W/Cm &/Or Wo Cm  Result Date: 03/10/2019 CLINICAL DATA:  Mid back pain, nausea. EXAM: CT ANGIOGRAPHY CHEST WITH CONTRAST TECHNIQUE: Multidetector CT imaging of the chest was performed using the standard protocol during bolus administration of intravenous contrast. Multiplanar CT image reconstructions and MIPs were obtained to evaluate the vascular anatomy. CONTRAST:  9mL OMNIPAQUE IOHEXOL 350 MG/ML SOLN COMPARISON:  None. FINDINGS:  Cardiovascular: There is no pulmonary embolism identified within the main, lobar or segmental pulmonary arteries bilaterally. No thoracic aortic aneurysm or evidence of aortic dissection. Mild aortic atherosclerosis. Heart size is within normal limits. No pericardial effusion. Coronary artery calcifications. Mediastinum/Nodes: No mass or enlarged lymph nodes seen within the mediastinum. Esophagus is unremarkable. Trachea and central bronchi are unremarkable. Lungs/Pleura: Scattered thin walled cystic foci are seen throughout both lungs. Mild bibasilar atelectasis/scarring. Lungs otherwise clear. No evidence of pneumonia or pulmonary edema. No pleural effusion or pneumothorax. Upper Abdomen: LEFT renal cyst, incompletely imaged. Limited images of the upper abdomen are otherwise unremarkable. Musculoskeletal: No acute or suspicious osseous finding. Review of the MIP images confirms the above findings. IMPRESSION: 1. No acute findings. No pulmonary embolism. No aortic aneurysm or dissection. No pneumonia or pulmonary edema. 2. Scattered small thin walled cystic foci within each lung, most likely indicating underlying chronic lymphangioleiomyomatosis (LAM) or pulmonary Langerhans cell histiocytosis (EG). Aortic Atherosclerosis (ICD10-I70.0). Electronically Signed   By: Franki Cabot M.D.   On: 03/10/2019 16:40    Procedures Procedures (including critical care time)  Medications Ordered in ED Medications  iohexol (OMNIPAQUE) 350 MG/ML injection 100 mL (70 mLs Intravenous Contrast Given 03/10/19 1626)  ketorolac (TORADOL) 30 MG/ML injection 30 mg (30 mg Intravenous Given 03/10/19 1851)  acetaminophen (TYLENOL) tablet 650 mg (650 mg Oral Given 03/10/19 1850)    ED Course  I have reviewed the triage vital signs and the nursing notes.  Pertinent labs & imaging results that were available during my care of the patient were reviewed by me and considered in my medical decision making (see chart for  details).  Clinical Course as of Mar 09 1924  Sat Mar 10, 2019  1522 On RA.  SpO2: 92 % [HK]    Clinical Course User Index [HK] Gabriel Heady, PA-C   MDM Rules/Calculators/A&P                      64 year old male with past medical history of GERD, hypertension, diabetes presenting to the ED with a  chief complaint of chest pain and shortness of breath.  2 days ago started having mid back pain radiating to his shoulder blades.  Then had a squeezing/pressure sensation throughout his chest since yesterday.  Some improvement noted with tramadol yesterday but pain returned today.  He was diagnosed with diabetes in August 2020 and told to take Metformin but has not taking it and instead trying to control his hyperglycemia with diet.  He is currently discussing with his PCP regarding whether he needs to be on an antihypertensive.  Denies history of similar chest pain or shortness of breath in the past.  Denies a cough or recent COVID-19 exposures, leg swelling or recent immobilization.  On exam patient is overall well-appearing.  He is speaking in complete sentences without difficulty.  His oxygen saturation on room air fluctuates between 90 to 92% without signs of respiratory distress.  He is pain-free during my initial evaluation.  EKG without acute ischemic changes.  Initial and delta troponin are both negative.  CBC, BMP unremarkable.  Chest x-ray is unremarkable.  CT angio of the chest shows no evidence of PE but does show small cysts within the lungs which could be due to chronic lung disease mentioned above.  Patient was unaware of any chronic lung disease.  Because of this I feel like his oxygen saturations are most likely his baseline.  Pain controlled here.  I doubt ACS, pericarditis or other emergent cause of his chest pain today.  Feel that he will benefit from follow-up with PCP, pulmonology and possible referral for cardiology if he needs a stress test that he has not had one in over 10 years.   Patient is agreeable to the plan.  We will have him return for worsening symptoms.  Patient is hemodynamically stable, in NAD, and able to ambulate in the ED. Evaluation does not show pathology that would require ongoing emergent intervention or inpatient treatment. I explained the diagnosis to the patient. Pain has been managed and has no complaints prior to discharge. Patient is comfortable with above plan and is stable for discharge at this time. All questions were answered prior to disposition. Strict return precautions for returning to the ED were discussed. Encouraged follow up with PCP.   An After Visit Summary was printed and given to the patient.   Portions of this Sullivan were generated with Lobby[employer]. Dictation errors may occur despite best attempts at proofreading.  Final Clinical Impression(s) / ED Diagnoses Final diagnoses:  Chest wall pain    Rx / DC Orders ED Discharge Orders    None       Gabriel Heady, PA-C 03/10/19 1926    Little, Wenda Overland, MD 03/11/19 1104

## 2019-03-10 NOTE — ED Notes (Signed)
Patient transported to CT 

## 2019-03-10 NOTE — ED Notes (Signed)
Patient transported to X-ray 

## 2019-03-10 NOTE — ED Triage Notes (Signed)
Per [facility], pt from home, started having mid back pain, sharp on Sunday. Last night started having CP/Back pain, CP has now resolved in its own. Pt did have nausea earlier. No cardiac hx. Pt was recently started on metformin, cbg was 214 with ems. Has not started taking the metformin. Pt also has hx of HTN and per provider they are "working on a plan". Initial ems bp was 200/110 to 154/88. Pt took 800mg  Ibuprofen and pain has resolved.

## 2019-03-10 NOTE — ED Notes (Signed)
Patient verbalizes understanding of discharge instructions. Opportunity for questioning and answers were provided. Armband removed by staff, pt discharged from ED.  

## 2019-03-12 ENCOUNTER — Other Ambulatory Visit: Payer: Self-pay

## 2019-03-12 ENCOUNTER — Encounter: Payer: Self-pay | Admitting: Internal Medicine

## 2019-03-12 ENCOUNTER — Ambulatory Visit: Payer: PRIVATE HEALTH INSURANCE | Admitting: Internal Medicine

## 2019-03-12 DIAGNOSIS — I10 Essential (primary) hypertension: Secondary | ICD-10-CM

## 2019-03-12 DIAGNOSIS — R0602 Shortness of breath: Secondary | ICD-10-CM | POA: Insufficient documentation

## 2019-03-12 NOTE — Progress Notes (Signed)
Subjective:    Patient ID: Gabriel Sullivan, male    DOB: 1955-07-13, 63 y.o.   MRN: [id number]  HPI Here for ER follow up This visit occurred during the SARS-CoV-2 public health emergency.  Safety protocols were in place, including screening questions prior to the visit, additional usage of staff PPE, and extensive cleaning of exam room while observing appropriate contact time as indicated for disinfecting solutions.   Started with "catch in neck" about a week ago Worked down to shoulder blades and hips Then 5 days ago--was feeling it around his ribs Thought he pulled a muscle Had visit with work "Tele-doc"--was going to give lidocaine patch Tried muscle relaxer from the past and 800mg  ibuprofen Then started with trouble breathing 2 days ago BP was up 196/106  Went to [employer] okay Still took him to ER---- BP up to 200/110 (anxiety related?) Reviewed ER evaluation Got medications in ER----did help Some pain yesterday but it was better--built back up again (800mg  ibuprofen helped)  No sig cough No fever  Current Outpatient Medications on File Prior to Visit  Medication Sig Dispense Refill  . Loratadine (CLARITIN PO) Take 1 tablet by mouth daily.    . metFORMIN (GLUCOPHAGE) 500 MG tablet Take 1 tablet (500 mg total) by mouth daily with breakfast. 90 tablet 3  . Multiple Vitamin (MULTIVITAMIN PO) Take 1 tablet by mouth daily.      . Omega-3 Fatty Acids (FISH OIL PO) Take 1 capsule by mouth daily.    Marland Kitchen OVER THE COUNTER MEDICATION Take 1 tablet by mouth daily. For acid reflux    . tadalafil (CIALIS) 20 MG tablet Take 1 tablet (20 mg total) by mouth daily as needed. 10 tablet 11   No current facility-administered medications on file prior to visit.    No Known Allergies  Past Medical History:  Diagnosis Date  . Anxiety   . BPH (benign prostatic hypertrophy)   . GERD (gastroesophageal reflux disease)   . Hypertension     Past Surgical History:  Procedure Laterality  Date  . COLONOSCOPY  03/01/2008   "tortuous" , TIC's  . CORNEAL TRANSPLANT  2016  . DOBUTAMINE STRESS ECHO  2009   negative    Family History  Problem Relation Age of Onset  . Arthritis Sister   . Stroke Mother   . COPD Brother   . Diabetes Neg Hx   . Hypertension Neg Hx   . Heart disease Neg Hx   . Cancer Neg Hx   . Colon cancer Neg Hx   . Colon polyps Neg Hx   . Esophageal cancer Neg Hx   . Stomach cancer Neg Hx   . Rectal cancer Neg Hx     Social History   Socioeconomic History  . Marital status: Divorced    Spouse name: Not on file  . Number of children: 4  . Years of education: Not on file  . Highest education level: Not on file  Occupational History  . 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 Smoking status: Former Smoker    Types: Cigarettes  . Smokeless tobacco: Former Systems developer    Types: [place] date: 03/01/2011  Substance and Sexual Activity  . Alcohol use: Yes    Comment: occ  . Drug use: No  . Sexual activity: Not on file  Other Topics Concern  . Not on file  Social History  Narrative   Divorced twice   Social Determinants of Health   Financial Resource Strain:   . Difficulty of Paying Living Expenses: Not on file  Food Insecurity:   . Worried About Charity fundraiser in the Last Year: Not on file  . Ran Out of Food in the Last Year: Not on file  Transportation Needs:   . Lack of Transportation (Medical): Not on file  . Lack of Transportation (Non-Medical): Not on file  Physical Activity:   . Days of Exercise per Week: Not on file  . Minutes of Exercise per Session: Not on file  Stress:   . Feeling of Stress : Not on file  Social Connections:   . Frequency of Communication with Friends and Family: Not on file  . Frequency of Social Gatherings with Friends and Family: Not on file  . Attends Religious Services: Not on file  . Active Member of Clubs or Organizations: Not on  file  . Attends Archivist Meetings: Not on file  . Marital Status: Not on file  Intimate Partner Violence:   . Fear of Current or Ex-Partner: Not on file  . Emotionally Abused: Not on file  . Physically Abused: Not on file  . Sexually Abused: Not on file   Review of Systems No known injury Slight nausea 3 nights ago--?indigestion (takes OTC med for GERD)     Objective:   Physical Exam  Neck: No thyromegaly present.  Cardiovascular: Normal rate, regular rhythm and normal heart sounds. Exam reveals no gallop.  No murmur heard. Respiratory: Effort normal and breath sounds normal. No respiratory distress. He has no wheezes. He has no rales.  Did have pain across lower anterior thorax bilaterally with big breath  Musculoskeletal:        General: No edema.  Lymphadenopathy:    He has no cervical adenopathy.           Assessment & Plan:

## 2019-03-12 NOTE — Assessment & Plan Note (Signed)
Prompted ER evaluation No evidence of vascular etiology New chest CT finding ---could be related to his symptoms Will be seeing pulmonary later this week

## 2019-03-12 NOTE — Assessment & Plan Note (Signed)
BP Readings from Last 3 Encounters:  03/12/19 132/90  03/10/19 (!) 142/92  12/14/18 (!) 166/116   Has been high at home He will monitor

## 2019-03-13 ENCOUNTER — Ambulatory Visit: Payer: PRIVATE HEALTH INSURANCE | Admitting: Internal Medicine

## 2019-03-13 ENCOUNTER — Other Ambulatory Visit: Payer: Self-pay

## 2019-03-13 ENCOUNTER — Encounter: Payer: Self-pay | Admitting: Internal Medicine

## 2019-03-13 DIAGNOSIS — N281 Cyst of kidney, acquired: Secondary | ICD-10-CM | POA: Diagnosis not present

## 2019-03-13 DIAGNOSIS — J849 Interstitial pulmonary disease, unspecified: Secondary | ICD-10-CM | POA: Diagnosis not present

## 2019-03-13 NOTE — Progress Notes (Signed)
Synopsis: Abnormal CT Chest  Assessment & Plan:  Problem 1 cystic lung disease in patient with history of smoking- probably old or burned out [place].  Asymptomatic and not related to possible pleurisy, which is self-resolving.  It would be incredibly unlikely that a 64 year old man have clinically significant LAM without  but it is under-diagnosed. - Reassurance provided - Repeat CT chest around October to assure stability in cyst size and distribution then can be PRN f/u - Will also image CT abd for kidney cyst on off chance this is a LAM variant and that is a angiomyolipoma. - Do not think further aggressive workup needed unless develops symptoms  MDM . I reviewed prior external note(s) from [facility] ER 03/10/19 . I reviewed the result(s) of CMP, CBC on 03/10/19 . I have ordered CT chest/abd for Oct 2021  Review of patient's CT images on 03/10/19 reveal scattered small thin walled cysts as well as a kidney cyst. The patient's images have been independently reviewed by me.     End of visit medications:  Current Outpatient Medications:  .  Loratadine (CLARITIN PO), Take 1 tablet by mouth daily., Disp: , Rfl:  .  metFORMIN (GLUCOPHAGE) 500 MG tablet, Take 1 tablet (500 mg total) by mouth daily with breakfast., Disp: 90 tablet, Rfl: 3 .  Multiple Vitamin (MULTIVITAMIN PO), Take 1 tablet by mouth daily.  , Disp: , Rfl:  .  Omega-3 Fatty Acids (FISH OIL PO), Take 1 capsule by mouth daily., Disp: , Rfl:  .  OVER THE COUNTER MEDICATION, Take 1 tablet by mouth daily. For acid reflux, Disp: , Rfl:  .  tadalafil (CIALIS) 20 MG tablet, Take 1 tablet (20 mg total) by mouth daily as needed., Disp: 10 tablet, Rfl: Oyens, MD [facility] 03/14/2019 6:27 PM    Subjective:   PATIENT ID: Gabriel Sullivan GENDER: male DOB: September 25, 1955, MRN: [id number]  Chief Complaint  Patient presents with  . Consult    Patient is here for abnormal CT chest . Patient went to ED on  Saturday for chest pain and had a CT done and was sent here to go over it.    HPI Patient had strange migrating pains in neck that settled around chest, worse with inspiration.  Led to an ER visit which led to a CTA chest.  CTA chest showed incidental cystic lung disease for which patient is referred to pulmonology.  No history of lung disease, pneumonia.  Distant hx of smoking.  MMRC 0. No cough, wheezing.  Chest pain is easing up with time and is mild at present.  Ancillary information including prior medications, full medical/surgical/family/social histoies, and PFTs (when available) are listed below and have been reviewed.   ROS + symptoms in bold Fevers, chills, weight loss Nausea, vomiting, diarrhea Shortness of breath, wheezing, cough Chest pain, palpitations, lower ext edema   Objective:   Vitals:   03/13/19 1427  BP: (!) 140/96  Pulse: 90  Temp: (!) 97.2 F (36.2 C)  TempSrc: Temporal  SpO2: 95%  Weight: 215 lb 12.8 oz (97.9 kg)  Height: 5\' 10"  (1.778 m)   95% on RA BMI Readings from Last 3 Encounters:  03/13/19 30.96 kg/m  03/12/19 30.99 kg/m  03/10/19 30.56 kg/m   Wt Readings from Last 3 Encounters:  03/13/19 215 lb 12.8 oz (97.9 kg)  03/12/19 216 lb (98 kg)  03/10/19 213 lb (96.6 kg)    GEN:  well appearing man in no acute distress HEENT: trachea midline, mucus membranes moist CV: Regular rate and rhythm, extremities are warm PULM: Clear, no accessory muscle use GI: Soft, +BS EXT: No edema NEURO: Moves all 4 extremities   Ancillary Information    Past Medical History:  Diagnosis Date  . Anxiety   . BPH (benign prostatic hypertrophy)   . GERD (gastroesophageal reflux disease)   . Hypertension      Family History  Problem Relation Age of Onset  . Arthritis Sister   . Stroke Mother   . COPD Brother   . Diabetes Neg Hx   . Hypertension Neg Hx   . Heart disease Neg Hx   . Cancer Neg Hx   . Colon cancer Neg Hx   . Colon polyps Neg Hx   .  Esophageal cancer Neg Hx   . Stomach cancer Neg Hx   . Rectal cancer Neg Hx      Past Surgical History:  Procedure Laterality Date  . COLONOSCOPY  03/01/2008   "tortuous" , TIC's  . CORNEAL TRANSPLANT  2016  . DOBUTAMINE STRESS ECHO  2009   negative    Social History   Socioeconomic History  . Marital status: Divorced    Spouse name: Not on file  . Number of children: 4  . Years of education: Not on file  . Highest education level: Not on file  Occupational History  . 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    Comment: Managing department for [employer]  Tobacco Use  . Smoking status: Former Smoker    Types: Cigarettes    Quit date: 03/12/1986    Years since quitting: 33.0  . Smokeless tobacco: Former Systems developer    Types: Union date: 03/01/2011  Substance and Sexual Activity  . Alcohol use: Yes    Comment: occ  . Drug use: No  . Sexual activity: Not on file  Other Topics Concern  . Not on file  Social History Narrative   Divorced twice   Social Determinants of Health   Financial Resource Strain:   . Difficulty of Paying Living Expenses: Not on file  Food Insecurity:   . Worried About Charity fundraiser in the Last Year: Not on file  . Ran Out of Food in the Last Year: Not on file  Transportation Needs:   . Lack of Transportation (Medical): Not on file  . Lack of Transportation (Non-Medical): Not on file  Physical Activity:   . Days of Exercise per Week: Not on file  . Minutes of Exercise per Session: Not on file  Stress:   . Feeling of Stress : Not on file  Social Connections:   . Frequency of Communication with Friends and Family: Not on file  . Frequency of Social Gatherings with Friends and Family: Not on file  . Attends Religious Services: Not on file  . Active Member of Clubs or Organizations: Not on file  . Attends Archivist Meetings: Not on file  . Marital Status: Not on file  Intimate Partner Violence:   . Fear of Current or  Ex-Partner: Not on file  . Emotionally Abused: Not on file  . Physically Abused: Not on file  . Sexually Abused: Not on file     No Known Allergies   CBC    Component Value Date/Time   WBC 10.9 (H) 03/10/2019 1526   RBC 4.84 03/10/2019 1526   HGB 15.4 03/10/2019 1526  HCT 46.0 03/10/2019 1526   PLT 282 03/10/2019 1526   MCV 95.0 03/10/2019 1526   MCH 31.8 03/10/2019 1526   MCHC 33.5 03/10/2019 1526   RDW 12.3 03/10/2019 1526   LYMPHSABS 2.0 03/10/2019 1526   MONOABS 0.9 03/10/2019 1526   EOSABS 0.3 03/10/2019 1526   BASOSABS 0.0 03/10/2019 1526    Pulmonary Functions Testing Results: No flowsheet data found.  Outpatient Medications Prior to Visit  Medication Sig Dispense Refill  . Loratadine (CLARITIN PO) Take 1 tablet by mouth daily.    . metFORMIN (GLUCOPHAGE) 500 MG tablet Take 1 tablet (500 mg total) by mouth daily with breakfast. 90 tablet 3  . Multiple Vitamin (MULTIVITAMIN PO) Take 1 tablet by mouth daily.      . Omega-3 Fatty Acids (FISH OIL PO) Take 1 capsule by mouth daily.    Marland Kitchen OVER THE COUNTER MEDICATION Take 1 tablet by mouth daily. For acid reflux    . tadalafil (CIALIS) 20 MG tablet Take 1 tablet (20 mg total) by mouth daily as needed. 10 tablet 11   No facility-administered medications prior to visit.

## 2019-03-13 NOTE — Patient Instructions (Signed)
We will call you once we get CT back and go from there.

## 2019-08-07 DIAGNOSIS — H40053 Ocular hypertension, bilateral: Secondary | ICD-10-CM | POA: Diagnosis not present

## 2019-08-29 DIAGNOSIS — H903 Sensorineural hearing loss, bilateral: Secondary | ICD-10-CM | POA: Diagnosis not present

## 2019-09-12 ENCOUNTER — Ambulatory Visit (INDEPENDENT_AMBULATORY_CARE_PROVIDER_SITE_OTHER): Payer: Federal, State, Local not specified - PPO | Admitting: Internal Medicine

## 2019-09-12 ENCOUNTER — Encounter: Payer: Self-pay | Admitting: Internal Medicine

## 2019-09-12 ENCOUNTER — Other Ambulatory Visit: Payer: Self-pay

## 2019-09-12 VITALS — BP 130/90 | HR 58 | Temp 97.3°F | Ht 69.75 in | Wt 219.0 lb

## 2019-09-12 DIAGNOSIS — I10 Essential (primary) hypertension: Secondary | ICD-10-CM

## 2019-09-12 DIAGNOSIS — Z125 Encounter for screening for malignant neoplasm of prostate: Secondary | ICD-10-CM | POA: Diagnosis not present

## 2019-09-12 DIAGNOSIS — Z0000 Encounter for general adult medical examination without abnormal findings: Secondary | ICD-10-CM

## 2019-09-12 DIAGNOSIS — I700 Atherosclerosis of aorta: Secondary | ICD-10-CM

## 2019-09-12 DIAGNOSIS — K219 Gastro-esophageal reflux disease without esophagitis: Secondary | ICD-10-CM

## 2019-09-12 DIAGNOSIS — R7303 Prediabetes: Secondary | ICD-10-CM

## 2019-09-12 LAB — CBC
HCT: 44.4 % (ref 39.0–52.0)
Hemoglobin: 14.8 g/dL (ref 13.0–17.0)
MCHC: 33.4 g/dL (ref 30.0–36.0)
MCV: 95.7 fl (ref 78.0–100.0)
Platelets: 250 10*3/uL (ref 150.0–400.0)
RBC: 4.64 Mil/uL (ref 4.22–5.81)
RDW: 13.5 % (ref 11.5–15.5)
WBC: 7.8 10*3/uL (ref 4.0–10.5)

## 2019-09-12 LAB — COMPREHENSIVE METABOLIC PANEL
ALT: 18 U/L (ref 0–53)
AST: 16 U/L (ref 0–37)
Albumin: 4.2 g/dL (ref 3.5–5.2)
Alkaline Phosphatase: 65 U/L (ref 39–117)
BUN: 16 mg/dL (ref 6–23)
CO2: 29 mEq/L (ref 19–32)
Calcium: 9.5 mg/dL (ref 8.4–10.5)
Chloride: 102 mEq/L (ref 96–112)
Creatinine, Ser: 1.16 mg/dL (ref 0.40–1.50)
GFR: 63.82 mL/min (ref >60.00)
Glucose, Bld: 114 mg/dL — ABNORMAL HIGH (ref 70–99)
Potassium: 5.3 mEq/L — ABNORMAL HIGH (ref 3.5–5.1)
Sodium: 139 mEq/L (ref 135–145)
Total Bilirubin: 0.4 mg/dL (ref 0.2–1.2)
Total Protein: 7.3 g/dL (ref 6.0–8.3)

## 2019-09-12 LAB — LIPID PANEL
Cholesterol: 202 mg/dL — ABNORMAL HIGH (ref 0–200)
HDL: 53.3 mg/dL (ref >39.00)
LDL Cholesterol: 128 mg/dL — ABNORMAL HIGH (ref 0–99)
NonHDL: 148.5
Total CHOL/HDL Ratio: 4
Triglycerides: 105 mg/dL (ref 0.0–149.0)
VLDL: 21 mg/dL (ref 0.0–40.0)

## 2019-09-12 LAB — PSA: PSA: 0.57 ng/mL (ref 0.10–4.00)

## 2019-09-12 LAB — HEMOGLOBIN A1C: Hgb A1c MFr Bld: 5.9 % (ref 4.6–6.5)

## 2019-09-12 MED ORDER — ATORVASTATIN CALCIUM 10 MG PO TABS: 10.0000 mg | ORAL_TABLET | ORAL | 3 refills | Status: DC

## 2019-09-12 NOTE — Assessment & Plan Note (Signed)
Discussed statin Rx Will consider this

## 2019-09-12 NOTE — Assessment & Plan Note (Signed)
Hopefully better on the metformin

## 2019-09-12 NOTE — Progress Notes (Signed)
Subjective:    Patient ID: Gabriel Sullivan, male    DOB: 1955-08-04, 64 y.o.   MRN: [id number]  HPI Here for physical This visit occurred during the SARS-CoV-2 public health emergency.  Safety protocols were in place, including screening questions prior to the visit, additional usage of staff PPE, and extensive cleaning of exam room while observing appropriate contact time as indicated for disinfecting solutions.   Did go see the pulmonary specialist Will need follow up CT scan later this year  No problems with metformin Trying to eat healthy---more exercise  Current Outpatient Medications on File Prior to Visit  Medication Sig Dispense Refill   Loratadine (CLARITIN PO) Take 1 tablet by mouth daily.     metFORMIN (GLUCOPHAGE) 500 MG tablet Take 1 tablet (500 mg total) by mouth daily with breakfast. 90 tablet 3   Multiple Vitamin (MULTIVITAMIN PO) Take 1 tablet by mouth daily.       Omega-3 Fatty Acids (FISH OIL PO) Take 1 capsule by mouth daily.     OVER THE COUNTER MEDICATION Take 1 tablet by mouth daily. For acid reflux     tadalafil (CIALIS) 20 MG tablet Take 1 tablet (20 mg total) by mouth daily as needed. 10 tablet 11   No current facility-administered medications on file prior to visit.    No Known Allergies  Past Medical History:  Diagnosis Date   Anxiety    BPH (benign prostatic hypertrophy)    GERD (gastroesophageal reflux disease)    Hypertension     Past Surgical History:  Procedure Laterality Date   COLONOSCOPY  03/01/2008   "tortuous" , TIC's   CORNEAL TRANSPLANT  2016   DOBUTAMINE STRESS ECHO  2009   negative    Family History  Problem Relation Age of Onset   Arthritis Sister    Stroke Mother    COPD Brother    Diabetes Neg Hx    Hypertension Neg Hx    Heart disease Neg Hx    Cancer Neg Hx    Colon cancer Neg Hx    Colon polyps Neg Hx    Esophageal cancer Neg Hx    Stomach cancer Neg Hx    Rectal cancer Neg Hx      Social History   Socioeconomic History   Marital status: Married    Spouse name: Not on file   Number of children: 4   Years of education: Not on file   Highest education level: Not on file  Occupational History   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  Tobacco Use   Smoking status: Former Smoker    Types: Cigarettes    Quit date: 03/12/1986    Years since quitting: 33.5   Smokeless tobacco: Former Systems developer    Types: Chew    Quit date: 03/01/2011  Vaping Use   Vaping Use: Never used  Substance and Sexual Activity   Alcohol use: Yes    Comment: occ   Drug use: No   Sexual activity: Not on file  Other Topics Concern   Not on file  Social History Narrative   Divorced twice   Social Determinants of Health   Financial Resource Strain:    Difficulty of Paying Living Expenses:   Food Insecurity:    Worried About Charity fundraiser in the Last Year:    Ran Out of Food in the Last Year:   Transportation Needs:    Lack  of Transportation (Medical):    Lack of Transportation (Non-Medical):   Physical Activity:    Days of Exercise per Week:    Minutes of Exercise per Session:   Stress:    Feeling of Stress :   Social Connections:    Frequency of Communication with Friends and Family:    Frequency of Social Gatherings with Friends and Family:    Attends Religious Services:    Active Member of Clubs or Organizations:    Attends Music therapist:    Marital Status:   Intimate Partner Violence:    Fear of Current or Ex-Partner:    Emotionally Abused:    Physically Abused:    Sexually Abused:    Review of Systems  Constitutional: Negative for fatigue and unexpected weight change.       Wears seat belt  HENT: Positive for tinnitus. Negative for dental problem.        Hearing aides Keeps up with dentist  Eyes: Negative for redness and visual disturbance.       No diplopia or  unilateral vision loss  Respiratory: Negative for cough and chest tightness.        Gets DOE---attributes to conditioning  Cardiovascular: Negative for chest pain, palpitations and leg swelling.  Gastrointestinal: Negative for blood in stool and constipation.       Takes omeprazole daily--controls heartburn. Some swallowing symptoms--relates to PND  Genitourinary: Negative for urgency.       Mild urinary issues Occasionally uses the cialis--causes muscle pain  Musculoskeletal: Negative for arthralgias, back pain and joint swelling.  Skin: Negative for rash.       No suspicious lesions  Allergic/Immunologic: Positive for environmental allergies. Negative for immunocompromised state.       Loratadine daily and prn flonase  Neurological: Positive for headaches. Negative for dizziness, syncope and light-headedness.  Hematological: Negative for adenopathy. Does not bruise/bleed easily.  Psychiatric/Behavioral: Negative for dysphoric mood and sleep disturbance. The patient is not nervous/anxious.        Objective:   Physical Exam Constitutional:      Appearance: Normal appearance.  HENT:     Head: Normocephalic and atraumatic.     Right Ear: Tympanic membrane, ear canal and external ear normal.     Left Ear: Tympanic membrane, ear canal and external ear normal.     Mouth/Throat:     Pharynx: No oropharyngeal exudate or posterior oropharyngeal erythema.  Eyes:     Conjunctiva/sclera: Conjunctivae normal.     Pupils: Pupils are equal, round, and reactive to light.  Cardiovascular:     Rate and Rhythm: Normal rate and regular rhythm.     Pulses: Normal pulses.     Heart sounds: No murmur heard.  No gallop.   Pulmonary:     Effort: Pulmonary effort is normal.     Breath sounds: Normal breath sounds. No wheezing or rales.  Abdominal:     Palpations: Abdomen is soft.     Tenderness: There is no abdominal tenderness.  Musculoskeletal:     Cervical back: Neck supple.     Right lower  leg: No edema.     Left lower leg: No edema.  Lymphadenopathy:     Cervical: No cervical adenopathy.  Skin:    Findings: No rash.  Neurological:     General: No focal deficit present.     Mental Status: He is alert and oriented to person, place, and time.  Psychiatric:        Mood  and Affect: Mood normal.        Behavior: Behavior normal.            Assessment & Plan:

## 2019-09-12 NOTE — Assessment & Plan Note (Signed)
BP Readings from Last 3 Encounters:  09/12/19 130/90  03/13/19 (!) 140/96  03/12/19 132/90   Good control without meds

## 2019-09-12 NOTE — Assessment & Plan Note (Signed)
Healthy Working on fitness Discussed PSA---will check today Colon due again 2027 Offered shingrix--he will consider Encouraged flu vaccine in the fall

## 2019-09-12 NOTE — Assessment & Plan Note (Signed)
Okay with PPI

## 2019-12-11 ENCOUNTER — Ambulatory Visit (INDEPENDENT_AMBULATORY_CARE_PROVIDER_SITE_OTHER)
Admission: RE | Admit: 2019-12-11 | Discharge: 2019-12-11 | Disposition: A | Discharge status: Home / Self Care | Payer: Federal, State, Local not specified - PPO | Source: Ambulatory Visit | Attending: Internal Medicine | Admitting: Internal Medicine

## 2019-12-11 ENCOUNTER — Other Ambulatory Visit: Payer: Self-pay

## 2019-12-11 DIAGNOSIS — I2510 Atherosclerotic heart disease of native coronary artery without angina pectoris: Secondary | ICD-10-CM | POA: Diagnosis not present

## 2019-12-11 DIAGNOSIS — N281 Cyst of kidney, acquired: Secondary | ICD-10-CM | POA: Diagnosis not present

## 2019-12-11 DIAGNOSIS — I700 Atherosclerosis of aorta: Secondary | ICD-10-CM | POA: Diagnosis not present

## 2019-12-11 DIAGNOSIS — J849 Interstitial pulmonary disease, unspecified: Secondary | ICD-10-CM | POA: Diagnosis not present

## 2019-12-11 DIAGNOSIS — N2889 Other specified disorders of kidney and ureter: Secondary | ICD-10-CM | POA: Diagnosis not present

## 2019-12-11 DIAGNOSIS — J984 Other disorders of lung: Secondary | ICD-10-CM | POA: Diagnosis not present

## 2019-12-24 ENCOUNTER — Telehealth: Payer: Self-pay | Admitting: Internal Medicine

## 2019-12-24 NOTE — Telephone Encounter (Addendum)
Needs to update pharmacy info EM New pharmacy: [facility] [address] [place], [place] [address] He also has several prescriptions that he needs refills on

## 2019-12-24 NOTE — Telephone Encounter (Signed)
What medications?

## 2019-12-25 ENCOUNTER — Telehealth: Payer: Self-pay | Admitting: Internal Medicine

## 2019-12-25 MED ORDER — METFORMIN HCL 500 MG PO TABS: 500.0000 mg | ORAL_TABLET | Freq: Every day | ORAL | 3 refills | Status: DC

## 2019-12-25 MED ORDER — TADALAFIL 20 MG PO TABS: 20.0000 mg | ORAL_TABLET | Freq: Every day | ORAL | 11 refills | Status: DC | PRN

## 2019-12-25 MED ORDER — ATORVASTATIN CALCIUM 10 MG PO TABS: 10.0000 mg | ORAL_TABLET | ORAL | 3 refills | Status: DC

## 2019-12-25 NOTE — Telephone Encounter (Signed)
Copied this note in to the note from yesterday where it should have been placed.

## 2019-12-25 NOTE — Telephone Encounter (Signed)
Patient called back with the following:  Atorvastatin  Tadalafil Metformin Please send to [facility] [address] [place]

## 2019-12-25 NOTE — Addendum Note (Signed)
Addended by: Pilar Grammes on: 12/25/2019 11:05 AM   Modules accepted: Orders

## 2019-12-25 NOTE — Telephone Encounter (Signed)
Patient told me the following:  Atorvastatin  Tadalafil Metformin Please send to [facility] [address], [place]

## 2019-12-25 NOTE — Telephone Encounter (Signed)
Rx sent electronically.  

## 2020-02-12 ENCOUNTER — Encounter (HOSPITAL_BASED_OUTPATIENT_CLINIC_OR_DEPARTMENT_OTHER): Payer: Self-pay

## 2020-02-12 ENCOUNTER — Emergency Department (HOSPITAL_BASED_OUTPATIENT_CLINIC_OR_DEPARTMENT_OTHER)
Admission: EM | Admit: 2020-02-12 | Discharge: 2020-02-12 | Disposition: A | Discharge status: Home / Self Care | Payer: Federal, State, Local not specified - PPO | Attending: Emergency Medicine | Admitting: Emergency Medicine

## 2020-02-12 ENCOUNTER — Emergency Department (HOSPITAL_BASED_OUTPATIENT_CLINIC_OR_DEPARTMENT_OTHER): Payer: Federal, State, Local not specified - PPO

## 2020-02-12 ENCOUNTER — Other Ambulatory Visit: Payer: Self-pay

## 2020-02-12 DIAGNOSIS — R0781 Pleurodynia: Secondary | ICD-10-CM | POA: Insufficient documentation

## 2020-02-12 DIAGNOSIS — W19XXXA Unspecified fall, initial encounter: Secondary | ICD-10-CM

## 2020-02-12 DIAGNOSIS — I10 Essential (primary) hypertension: Secondary | ICD-10-CM | POA: Insufficient documentation

## 2020-02-12 DIAGNOSIS — W010XXA Fall on same level from slipping, tripping and stumbling without subsequent striking against object, initial encounter: Secondary | ICD-10-CM | POA: Insufficient documentation

## 2020-02-12 DIAGNOSIS — Z87891 Personal history of nicotine dependence: Secondary | ICD-10-CM | POA: Insufficient documentation

## 2020-02-12 DIAGNOSIS — J9811 Atelectasis: Secondary | ICD-10-CM | POA: Diagnosis not present

## 2020-02-12 DIAGNOSIS — R7303 Prediabetes: Secondary | ICD-10-CM | POA: Insufficient documentation

## 2020-02-12 MED ORDER — OXYCODONE-ACETAMINOPHEN 5-325 MG PO TABS: 1.0000 | ORAL_TABLET | Freq: Three times a day (TID) | ORAL | 0 refills | Status: DC | PRN

## 2020-02-12 NOTE — ED Triage Notes (Signed)
Pt states he fell hooking his camper ~130pm-fell onto fender-pain to left rib area-states he heard a pop-NAD-steady gait

## 2020-02-12 NOTE — Discharge Instructions (Addendum)
Your x-ray was without any evidence of fracture.  As we discussed this does not completely exclude the possibility of a rib fracture. Please use Tylenol or ibuprofen for pain.  You may use 600 mg ibuprofen every 6 hours or 1000 mg of Tylenol every 6 hours.  You may choose to alternate between the 2.  This would be most effective.  Not to exceed 4 g of Tylenol within 24 hours.  Not to exceed 3200 mg ibuprofen 24 hours.

## 2020-02-12 NOTE — ED Provider Notes (Signed)
[facility] EMERGENCY DEPARTMENT Provider Note   CSN: [id number] Arrival date & time: 02/12/20  1625     History Chief Complaint  Patient presents with  . Fall    Gabriel Sullivan is a 65 y.o. male.  HPI Patient is 65 year old male presenting today after he was leaning over his truck bed his foot slipped and he fell left rib cage first into the sidewall of his truck.  He states that it hurts with deep inhalation.  It is achy constant pain.  He states that his pain is minimal at this time.  He was concerned about rib fracture.  He states it is worse with touch and deep inhalation.  No mitigating factors he has taken no medications prior to arrival.  No other associate symptoms.  He states he did not hit his head or lose consciousness no other injuries.  He states that he slid to his feet and was able to walk and sit down.  He states that the pain has been constant since that time.  He says the time that happened was approximately 1:30 PM.     Past Medical History:  Diagnosis Date  . Anxiety   . BPH (benign prostatic hypertrophy)   . GERD (gastroesophageal reflux disease)   . Hypertension     Patient Active Problem List   Diagnosis Date Noted  . Aortic atherosclerosis ([place]) 09/12/2019  . SOB (shortness of breath) 03/12/2019  . Prediabetes 09/08/2018  . BPH (benign prostatic hypertrophy)   . Erectile dysfunction 05/24/2012  . Routine general medical examination at a health care facility 03/29/2011  . Sleep disturbance 12/03/2008  . GERD 05/22/2008  . Essential hypertension, benign 01/11/2007    Past Surgical History:  Procedure Laterality Date  . COLONOSCOPY  03/01/2008   "tortuous" , TIC's  . CORNEAL TRANSPLANT  2016  . DOBUTAMINE STRESS ECHO  2009   negative       Family History  Problem Relation Age of Onset  . Arthritis Sister   . Stroke Mother   . COPD Brother   . Diabetes Neg Hx   . Hypertension Neg Hx   . Heart disease Neg Hx   . Cancer Neg Hx    . Colon cancer Neg Hx   . Colon polyps Neg Hx   . Esophageal cancer Neg Hx   . Stomach cancer Neg Hx   . Rectal cancer Neg Hx     Social History   Tobacco Use  . Smoking status: Former Smoker    Types: Cigarettes    Quit date: 03/12/1986    Years since quitting: 33.9  . Smokeless tobacco: Former Systems developer    Types: Chew    Quit date: 03/01/2011  Vaping Use  . Vaping Use: Never used  Substance Use Topics  . Alcohol use: Yes    Comment: occ  . Drug use: No    Home Medications Prior to Admission medications   Medication Sig Start Date End Date Taking? Authorizing Provider  oxyCODONE-acetaminophen (PERCOCET/ROXICET) 5-325 MG tablet Take 1 tablet by mouth every 8 (eight) hours as needed for severe pain. 02/12/20  Yes Frimet Durfee S, PA  atorvastatin (LIPITOR) 10 MG tablet Take 1 tablet (10 mg total) by mouth every other day. 12/25/19   Venia Carbon, MD  Loratadine (CLARITIN PO) Take 1 tablet by mouth daily.    [provider]  metFORMIN (GLUCOPHAGE) 500 MG tablet Take 1 tablet (500 mg total) by mouth daily with breakfast. 12/25/19  Venia Carbon, MD  Multiple Vitamin (MULTIVITAMIN PO) Take 1 tablet by mouth daily.      [provider]  Omega-3 Fatty Acids (FISH OIL PO) Take 1 capsule by mouth daily.    [provider]  omeprazole (PRILOSEC) 20 MG capsule Take 20 mg by mouth daily.    [provider]  OVER THE COUNTER MEDICATION Take 1 tablet by mouth daily. For acid reflux    [provider]  tadalafil (CIALIS) 20 MG tablet Take 1 tablet (20 mg total) by mouth daily as needed. 12/25/19   Venia Carbon, MD    Allergies    Patient has no known allergies.  Review of Systems   Review of Systems  Constitutional: Negative for chills and fever.  HENT: Negative for congestion.   Respiratory: Negative for shortness of breath.   Cardiovascular: Negative for chest pain.  Gastrointestinal: Negative for abdominal pain.   Musculoskeletal: Negative for neck pain.       Left rib pain    Physical Exam Updated Vital Signs BP (!) 158/97 (BP Location: Left Arm)   Pulse 97   Temp 98.7 F (37.1 C) (Oral)   Resp 18   Ht 5\' 10"  (1.778 m)   Wt 99.8 kg   SpO2 99%   BMI 31.57 kg/m   Physical Exam Vitals and nursing note reviewed.  Constitutional:      General: He is not in acute distress.    Appearance: Normal appearance. He is not ill-appearing.  HENT:     Head: Normocephalic and atraumatic.  Eyes:     General: No scleral icterus.       Right eye: No discharge.        Left eye: No discharge.     Conjunctiva/sclera: Conjunctivae normal.  Pulmonary:     Effort: Pulmonary effort is normal.     Breath sounds: No stridor.  Musculoskeletal:     Comments: Tenderness to palpation of the left side of the chest which completely reproduces patient's symptoms.  Is tender over the fifth sixth seventh ribs the lateral left side  Skin:    General: Skin is warm and dry.     Comments: No bruising abrasions or lacerations.  No rashes.  Neurological:     Mental Status: He is alert and oriented to person, place, and time. Mental status is at baseline.     ED Results / Procedures / Treatments   Labs (all labs ordered are listed, but only abnormal results are displayed) Labs Reviewed - No data to display  EKG None  Radiology DG Ribs Unilateral W/Chest Left  Result Date: 02/12/2020 CLINICAL DATA:  Status post fall, fell onto fender of camper. Pain left rib area. Heard pop. Anterior mid ribs. Ex-smoker. Quit in the 80s. EXAM: LEFT RIBS AND CHEST - 3+ VIEW COMPARISON:  Chest x-ray 03/10/2019, chest x-ray 11/28/2012 FINDINGS: Punctate metallic BB marker noted overlying the left chest wall at the level of the tenth rib. Redemonstration of a likely benign (no associated soft tissue lesion on CT angio chest 03/10/2019) irregular scalloped appearance of the second left rib that may be slightly more apparent compared to  2014. No fracture or other bone lesions are seen involving the ribs. The heart size and mediastinal contours are within normal limits. Left base atelectasis. No focal consolidation. No pulmonary edema. No pleural effusion. No pneumothorax. No acute osseous abnormality. IMPRESSION: 1. No acute cardiopulmonary abnormality. 2. No acute displaced left rib fracture. Please note that  a nondisplaced rib fracture could be occult on radiograph. Electronically Signed   By: Iven Finn M.D.   On: 02/12/2020 17:01    Procedures Procedures (including critical care time)  Medications Ordered in ED Medications - No data to display  ED Course  I have reviewed the triage vital signs and the nursing notes.  Pertinent labs & imaging results that were available during my care of the patient were reviewed by me and considered in my medical decision making (see chart for details).    MDM Rules/Calculators/A&P                          Patient is 65 year old male presenting today after he was leaning over his truck bed his foot slipped and he fell left rib cage first into the sidewall of his truck.  He states that it hurts with deep inhalation.  It is achy constant pain.  He states that his pain is minimal at this time.  He was concerned about rib fracture.  Physical exam is notable for significant tenderness to palpation of the left-sided rib cage approximately ribs 5-7. No other significant abnormalities on examination.  Vital signs within normal notes apart from mild hypertension.  X-ray shows no evidence of rib fractures.  This does not exclude occult rib fracture however I had a shared that he may conversation patient he is agreeable to holding off on any CT imaging and treating symptomatically with incentive spirometer and analgesia.  Provide patient with 6 tablets of Percocet as well as Tylenol and ibuprofen recommendations.  Will follow-up with PCP.  Return precautions given.  Final Clinical  Impression(s) / ED Diagnoses Final diagnoses:  Fall, initial encounter  Rib pain on left side    Rx / DC Orders ED Discharge Orders         Ordered    oxyCODONE-acetaminophen (PERCOCET/ROXICET) 5-325 MG tablet  Every 8 hours PRN        02/12/20 2029           Tedd Sias, [place] 02/12/20 2034    Fredia Sorrow, MD 02/14/20 1900

## 2020-02-12 NOTE — ED Notes (Signed)
PA at bedside.

## 2020-02-15 DIAGNOSIS — H40053 Ocular hypertension, bilateral: Secondary | ICD-10-CM | POA: Diagnosis not present

## 2020-03-12 ENCOUNTER — Ambulatory Visit: Payer: Federal, State, Local not specified - PPO | Admitting: Internal Medicine

## 2020-03-12 ENCOUNTER — Encounter: Payer: Self-pay | Admitting: Internal Medicine

## 2020-03-12 ENCOUNTER — Other Ambulatory Visit: Payer: Self-pay

## 2020-03-12 DIAGNOSIS — I10 Essential (primary) hypertension: Secondary | ICD-10-CM | POA: Diagnosis not present

## 2020-03-12 MED ORDER — LISINOPRIL-HYDROCHLOROTHIAZIDE 20-12.5 MG PO TABS: 1.0000 | ORAL_TABLET | Freq: Every day | ORAL | 3 refills | Status: DC

## 2020-03-12 NOTE — Assessment & Plan Note (Addendum)
BP Readings from Last 3 Encounters:  03/12/20 (!) 160/104  02/12/20 (!) 140/106  09/12/19 130/90   Persistent elevations here and at home Will start lisinopril/HCTZ Recheck 4-6 weeks

## 2020-03-12 NOTE — Progress Notes (Signed)
Subjective:    Patient ID: Gabriel Sullivan, male    DOB: [birth year], 65 y.o.   MRN: [id number]  HPI Here for ER follow up and to reevaluate elevated BP This visit occurred during the SARS-CoV-2 public health emergency.  Safety protocols were in place, including screening questions prior to the visit, additional usage of staff PPE, and extensive cleaning of exam room while observing appropriate contact time as indicated for disinfecting solutions.   Had bad left rib pain-- slipped while trying to uncouple trailer [place] and hit left side against fender and heard pop Pain worsened through evening---so went for ER evaluation X-ray showed no rib fracture His pain is much better now  BP was up then---felt it was likely from pain Then he was checking at home --- 190/116 was the most His cuff correlated with ours here Some headaches--but not persistent  No chest pain No SOB No dizziness Feels well overall  Rash on lateral left calf Itchy Uses spray from wife--now basically gone  Current Outpatient Medications on File Prior to Visit  Medication Sig Dispense Refill  . atorvastatin (LIPITOR) 10 MG tablet Take 1 tablet (10 mg total) by mouth every other day. 45 tablet 3  . Loratadine (CLARITIN PO) Take 1 tablet by mouth daily.    . metFORMIN (GLUCOPHAGE) 500 MG tablet Take 1 tablet (500 mg total) by mouth daily with breakfast. 90 tablet 3  . Multiple Vitamin (MULTIVITAMIN PO) Take 1 tablet by mouth daily.    . Omega-3 Fatty Acids (FISH OIL PO) Take 1 capsule by mouth daily.    Marland Kitchen omeprazole (PRILOSEC) 20 MG capsule Take 20 mg by mouth daily.    Marland Kitchen OVER THE COUNTER MEDICATION Take 1 tablet by mouth daily. For acid reflux    . tadalafil (CIALIS) 20 MG tablet Take 1 tablet (20 mg total) by mouth daily as needed. 10 tablet 11   No current facility-administered medications on file prior to visit.    No Known Allergies  Past Medical History:  Diagnosis Date  . Anxiety   . BPH (benign  prostatic hypertrophy)   . GERD (gastroesophageal reflux disease)   . Hypertension     Past Surgical History:  Procedure Laterality Date  . COLONOSCOPY  03/01/2008   "tortuous" , TIC's  . CORNEAL TRANSPLANT  2016  . DOBUTAMINE STRESS ECHO  2009   negative    Family History  Problem Relation Age of Onset  . Arthritis Sister   . Stroke Mother   . COPD Brother   . Diabetes Neg Hx   . Hypertension Neg Hx   . Heart disease Neg Hx   . Cancer Neg Hx   . Colon cancer Neg Hx   . Colon polyps Neg Hx   . Esophageal cancer Neg Hx   . Stomach cancer Neg Hx   . Rectal cancer Neg Hx     Social History   Socioeconomic History  . Marital status: Married    Spouse name: Not on file  . Number of children: 4  . Years of education: Not on file  . Highest education level: Not on file  Occupational History  . 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    Comment: Managing department for [employer]  Tobacco Use  . Smoking status: Former Smoker    Types: Cigarettes    Quit date: 03/12/1986    Years since quitting: 34.0  . Smokeless tobacco: Former Systems developer    Types: Loss adjuster, chartered  Quit date: 03/01/2011  Vaping Use  . Vaping Use: Never used  Substance and Sexual Activity  . Alcohol use: Yes    Comment: occ  . Drug use: No  . Sexual activity: Not on file  Other Topics Concern  . Not on file  Social History Narrative   Divorced twice   Remarried 5/21   Social Determinants of Health   Financial Resource Strain: Not on file  Food Insecurity: Not on file  Transportation Needs: Not on file  Physical Activity: Not on file  Stress: Not on file  Social Connections: Not on file  Intimate Partner Violence: Not on file   Review of Systems Sleeps fairly well Nocturia x 1 usually Appetite is good Weight is about the same    Objective:   Physical Exam Constitutional:      Appearance: Normal appearance.  Cardiovascular:     Rate and Rhythm: Normal rate and regular rhythm.     Heart  sounds: No murmur heard. No gallop.   Pulmonary:     Effort: Pulmonary effort is normal.     Breath sounds: Normal breath sounds. No wheezing or rales.  Musculoskeletal:     Cervical back: Neck supple.     Right lower leg: No edema.     Left lower leg: No edema.  Lymphadenopathy:     Cervical: No cervical adenopathy.  Skin:    Comments: No sig rash on left calf---?mostly dry scaling  Neurological:     Mental Status: He is alert.            Assessment & Plan:

## 2020-03-24 ENCOUNTER — Telehealth: Payer: Federal, State, Local not specified - PPO | Admitting: Family

## 2020-03-24 DIAGNOSIS — R197 Diarrhea, unspecified: Secondary | ICD-10-CM

## 2020-03-24 NOTE — Progress Notes (Signed)
Based on what you shared with me, I feel your condition warrants further evaluation and I recommend that you be seen for a face to face office visit.  Given you are having blood and mucus with your diarrhea you need to be seen face to face to rule out a more serious infection.    NOTE: If you entered your credit card information for this eVisit, you will not be charged. You may see a "hold" on your card for the $35 but that hold will drop off and you will not have a charge processed.   If you are having a true medical emergency please call 911.      For an urgent face to face visit, [facility] has five urgent care centers for your convenience:     [facility] [facility] [place] Get Driving Directions [phone] [place], [place] [address] . 10 am - 6pm Monday - Friday    [facility] [place] [facility]) Get Driving Directions [phone] [address] [place], [place] [address] . 10 am to 8 pm Monday-Friday . 12 pm to 8 pm [facility] Get Driving Directions [phone] [place], [place], [place] [address] . 8 am to 8 pm Monday-Friday . 9 am to 6 pm Saturday . 11 am to 6 pm Sunday     [facility] at [facility] Get Driving Directions  [phone] [address] [place], [place] [address] . 8 am to 8 pm Monday-Friday . 8 am to 4 pm [facility] at [place] Get Driving Directions [phone] [place] [place],  [address] . 12 pm to 6 pm Monday-Friday      Your e-visit answers were reviewed by a board certified advanced clinical practitioner to complete your personal care plan.  Thank you for using e-Visits.

## 2020-03-25 ENCOUNTER — Ambulatory Visit: Payer: Federal, State, Local not specified - PPO | Admitting: Internal Medicine

## 2020-03-25 ENCOUNTER — Encounter: Payer: Self-pay | Admitting: Internal Medicine

## 2020-03-25 ENCOUNTER — Other Ambulatory Visit: Payer: Self-pay

## 2020-03-25 DIAGNOSIS — K922 Gastrointestinal hemorrhage, unspecified: Secondary | ICD-10-CM | POA: Insufficient documentation

## 2020-03-25 NOTE — Assessment & Plan Note (Signed)
Had cramping and symptoms suggesting acute infection--with significant bleeding after several diarrheal stools. No diverticulosis on recent colon Did eat popcorn that causes intestinal problems in the past---I don't see that as being the cause No clear other exposures Rapid course doesn't sound like IBD---but would send back to GI if any recurrence  Symptoms are better now except ongoing mild cramping--dysentery type illness Observe for now

## 2020-03-25 NOTE — Patient Instructions (Signed)
If you have more bleeding--I will set you up to see the GI specialist again.

## 2020-03-25 NOTE — Progress Notes (Signed)
Subjective:    Patient ID: Gabriel Sullivan, male    DOB: [birth year], 65 y.o.   MRN: [id number]  HPI Here due to intestinal problems This visit occurred during the SARS-CoV-2 public health emergency.  Safety protocols were in place, including screening questions prior to the visit, additional usage of staff PPE, and extensive cleaning of exam room while observing appropriate contact time as indicated for disinfecting solutions.   Started yesterday morning ~1AM Got stomach cramping Diarrhea and vomiting at the same time About 4 more diarrhea spells during the night Then when he awoke---back to the bathroom, but it was just blood (bowl and toilet paper). Not really any stool (or blood covered) Had several more spells  Finally stopped bleeding ~7PM yesterday Had black and bright red blob ----and no stools since then Never vomited after the first spell  Ate lunch and supper yesterday---very light Had muffin this AM and coffee  Lower abdominal pain--cramping. Still today Some in the low back as well  Current Outpatient Medications on File Prior to Visit  Medication Sig Dispense Refill  . atorvastatin (LIPITOR) 10 MG tablet Take 1 tablet (10 mg total) by mouth every other day. 45 tablet 3  . lisinopril-hydrochlorothiazide (ZESTORETIC) 20-12.5 MG tablet Take 1 tablet by mouth daily. 90 tablet 3  . Loratadine (CLARITIN PO) Take 1 tablet by mouth daily.    . metFORMIN (GLUCOPHAGE) 500 MG tablet Take 1 tablet (500 mg total) by mouth daily with breakfast. 90 tablet 3  . Multiple Vitamin (MULTIVITAMIN PO) Take 1 tablet by mouth daily.    . Omega-3 Fatty Acids (FISH OIL PO) Take 1 capsule by mouth daily.    Marland Kitchen omeprazole (PRILOSEC) 20 MG capsule Take 20 mg by mouth daily.    Marland Kitchen OVER THE COUNTER MEDICATION Take 1 tablet by mouth daily. For acid reflux    . tadalafil (CIALIS) 20 MG tablet Take 1 tablet (20 mg total) by mouth daily as needed. 10 tablet 11   No current facility-administered  medications on file prior to visit.    No Known Allergies  Past Medical History:  Diagnosis Date  . Anxiety   . BPH (benign prostatic hypertrophy)   . GERD (gastroesophageal reflux disease)   . Hypertension     Past Surgical History:  Procedure Laterality Date  . COLONOSCOPY  03/01/2008   "tortuous" , TIC's  . CORNEAL TRANSPLANT  2016  . DOBUTAMINE STRESS ECHO  2009   negative    Family History  Problem Relation Age of Onset  . Arthritis Sister   . Stroke Mother   . COPD Brother   . Diabetes Neg Hx   . Hypertension Neg Hx   . Heart disease Neg Hx   . Cancer Neg Hx   . Colon cancer Neg Hx   . Colon polyps Neg Hx   . Esophageal cancer Neg Hx   . Stomach cancer Neg Hx   . Rectal cancer Neg Hx     Social History   Socioeconomic History  . Marital status: Married    Spouse name: Not on file  . Number of children: 4  . Years of education: Not on file  . Highest education level: Not on file  Occupational History  . 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    Comment: back on his own  Tobacco Use  . Smoking status: Former Smoker    Types: Cigarettes    Quit date: 03/12/1986    Years since quitting: 34.0  .  Smokeless tobacco: Former Systems developer    Types: Chew    Quit date: 03/01/2011  Vaping Use  . Vaping Use: Never used  Substance and Sexual Activity  . Alcohol use: Yes    Comment: occ  . Drug use: No  . Sexual activity: Not on file  Other Topics Concern  . Not on file  Social History Narrative   Divorced twice   Remarried 5/21   Social Determinants of Health   Financial Resource Strain: Not on file  Food Insecurity: Not on file  Transportation Needs: Not on file  Physical Activity: Not on file  Stress: Not on file  Social Connections: Not on file  Intimate Partner Violence: Not on file   Review of Systems No fever BP has been okay Stable metformin dosing No recent travel Nobody else with symptoms No suspect food    Objective:   Physical  Exam Constitutional:      Appearance: Normal appearance.  Pulmonary:     Effort: Pulmonary effort is normal.     Breath sounds: Normal breath sounds. No wheezing or rales.  Abdominal:     General: There is no distension.     Palpations: Abdomen is soft. There is no mass.     Tenderness: There is no guarding or rebound.     Comments: Mild LLQ tenderness  Genitourinary:    Comments: Mild perirectal redness but no hemorrhoids or apparent fissure Neurological:     Mental Status: He is alert.            Assessment & Plan:

## 2020-03-25 NOTE — Telephone Encounter (Signed)
Pt did have OV today before this msg was observed in basket.

## 2020-03-25 NOTE — Telephone Encounter (Signed)
Pt is coming in at 12pm.

## 2020-04-09 ENCOUNTER — Encounter: Payer: Self-pay | Admitting: Internal Medicine

## 2020-04-09 ENCOUNTER — Other Ambulatory Visit: Payer: Self-pay

## 2020-04-09 ENCOUNTER — Ambulatory Visit: Payer: Federal, State, Local not specified - PPO | Admitting: Internal Medicine

## 2020-04-09 DIAGNOSIS — I10 Essential (primary) hypertension: Secondary | ICD-10-CM | POA: Diagnosis not present

## 2020-04-09 LAB — RENAL FUNCTION PANEL
Albumin: 4.2 g/dL (ref 3.5–5.2)
BUN: 20 mg/dL (ref 6–23)
CO2: 28 mEq/L (ref 19–32)
Calcium: 9.7 mg/dL (ref 8.4–10.5)
Chloride: 100 mEq/L (ref 96–112)
Creatinine, Ser: 1.07 mg/dL (ref 0.40–1.50)
GFR: 74.39 mL/min (ref >60.00)
Glucose, Bld: 126 mg/dL — ABNORMAL HIGH (ref 70–99)
Phosphorus: 2.9 mg/dL (ref 2.3–4.6)
Potassium: 5.1 mEq/L (ref 3.5–5.1)
Sodium: 137 mEq/L (ref 135–145)

## 2020-04-09 NOTE — Progress Notes (Signed)
Subjective:    Patient ID: Gabriel Sullivan, male    DOB: Jan 01, 1956, 65 y.o.   MRN: [id number]  HPI Here for follow up of HTN This visit occurred during the SARS-CoV-2 public health emergency.  Safety protocols were in place, including screening questions prior to the visit, additional usage of staff PPE, and extensive cleaning of exam room while observing appropriate contact time as indicated for disinfecting solutions.   GI illness did resolve Did have one spell of cramping again 3 days ago Moved bowels and slight chill--but no blood ?related to pepper jack cheese?? Never lactose intolerant before  Checking BP at home It has been fine No problems with medication---did drop his BP very low at first. Had to alternate days briefly Now taking daily No chest pain or SOB  Current Outpatient Medications on File Prior to Visit  Medication Sig Dispense Refill  . atorvastatin (LIPITOR) 10 MG tablet Take 1 tablet (10 mg total) by mouth every other day. 45 tablet 3  . lisinopril-hydrochlorothiazide (ZESTORETIC) 20-12.5 MG tablet Take 1 tablet by mouth daily. 90 tablet 3  . Loratadine (CLARITIN PO) Take 1 tablet by mouth daily.    . metFORMIN (GLUCOPHAGE) 500 MG tablet Take 1 tablet (500 mg total) by mouth daily with breakfast. 90 tablet 3  . Multiple Vitamin (MULTIVITAMIN PO) Take 1 tablet by mouth daily.    . Omega-3 Fatty Acids (FISH OIL PO) Take 1 capsule by mouth daily.    Marland Kitchen omeprazole (PRILOSEC) 20 MG capsule Take 20 mg by mouth daily.    Marland Kitchen OVER THE COUNTER MEDICATION Take 1 tablet by mouth daily. For acid reflux    . tadalafil (CIALIS) 20 MG tablet Take 1 tablet (20 mg total) by mouth daily as needed. 10 tablet 11   No current facility-administered medications on file prior to visit.    No Known Allergies  Past Medical History:  Diagnosis Date  . Anxiety   . BPH (benign prostatic hypertrophy)   . GERD (gastroesophageal reflux disease)   . Hypertension     Past Surgical  History:  Procedure Laterality Date  . COLONOSCOPY  03/01/2008   "tortuous" , TIC's  . CORNEAL TRANSPLANT  2016  . DOBUTAMINE STRESS ECHO  2009   negative    Family History  Problem Relation Age of Onset  . Arthritis Sister   . Stroke Mother   . COPD Brother   . Diabetes Neg Hx   . Hypertension Neg Hx   . Heart disease Neg Hx   . Cancer Neg Hx   . Colon cancer Neg Hx   . Colon polyps Neg Hx   . Esophageal cancer Neg Hx   . Stomach cancer Neg Hx   . Rectal cancer Neg Hx     Social History   Socioeconomic History  . Marital status: Married    Spouse name: Not on file  . Number of children: 4  . Years of education: Not on file  . Highest education level: Not on file  Occupational History  . 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    Comment: back on his own  Tobacco Use  . Smoking status: Former Smoker    Types: Cigarettes    Quit date: 03/12/1986    Years since quitting: 34.1  . Smokeless tobacco: Former Systems developer    Types: Chew    Quit date: 03/01/2011  Vaping Use  . Vaping Use: Never used  Substance and Sexual Activity  . Alcohol use:  Yes    Comment: occ  . Drug use: No  . Sexual activity: Not on file  Other Topics Concern  . Not on file  Social History Narrative   Divorced twice   Remarried 5/21   Social Determinants of Health   Financial Resource Strain: Not on file  Food Insecurity: Not on file  Transportation Needs: Not on file  Physical Activity: Not on file  Stress: Not on file  Social Connections: Not on file  Intimate Partner Violence: Not on file   Review of Systems No cough No throat symptoms    Objective:   Physical Exam Constitutional:      Appearance: Normal appearance.  Cardiovascular:     Rate and Rhythm: Normal rate and regular rhythm.     Heart sounds: No murmur heard. No gallop.   Pulmonary:     Effort: Pulmonary effort is normal.     Breath sounds: Normal breath sounds. No wheezing or rales.  Musculoskeletal:     Cervical  back: Neck supple.     Right lower leg: No edema.     Left lower leg: No edema.  Lymphadenopathy:     Cervical: No cervical adenopathy.  Neurological:     Mental Status: He is alert.            Assessment & Plan:

## 2020-04-09 NOTE — Assessment & Plan Note (Signed)
BP Readings from Last 3 Encounters:  04/09/20 118/82  03/25/20 118/76  03/12/20 (!) 160/104   Good response to lisinopril HCTZ No side effects (other than hypotension at first) Will check renal profile

## 2020-09-16 ENCOUNTER — Ambulatory Visit (INDEPENDENT_AMBULATORY_CARE_PROVIDER_SITE_OTHER): Payer: Federal, State, Local not specified - PPO | Admitting: Internal Medicine

## 2020-09-16 ENCOUNTER — Encounter: Payer: Self-pay | Admitting: Internal Medicine

## 2020-09-16 ENCOUNTER — Other Ambulatory Visit: Payer: Self-pay

## 2020-09-16 VITALS — BP 108/80 | HR 73 | Temp 97.4°F | Ht 69.75 in | Wt 203.0 lb

## 2020-09-16 DIAGNOSIS — Z0000 Encounter for general adult medical examination without abnormal findings: Secondary | ICD-10-CM | POA: Diagnosis not present

## 2020-09-16 DIAGNOSIS — K219 Gastro-esophageal reflux disease without esophagitis: Secondary | ICD-10-CM | POA: Diagnosis not present

## 2020-09-16 DIAGNOSIS — R7303 Prediabetes: Secondary | ICD-10-CM

## 2020-09-16 DIAGNOSIS — N289 Disorder of kidney and ureter, unspecified: Secondary | ICD-10-CM | POA: Insufficient documentation

## 2020-09-16 DIAGNOSIS — I10 Essential (primary) hypertension: Secondary | ICD-10-CM

## 2020-09-16 LAB — COMPREHENSIVE METABOLIC PANEL
ALT: 30 U/L (ref 0–53)
AST: 51 U/L — ABNORMAL HIGH (ref 0–37)
Albumin: 4.3 g/dL (ref 3.5–5.2)
Alkaline Phosphatase: 57 U/L (ref 39–117)
BUN: 19 mg/dL (ref 6–23)
CO2: 30 mEq/L (ref 19–32)
Calcium: 9.9 mg/dL (ref 8.4–10.5)
Chloride: 100 mEq/L (ref 96–112)
Creatinine, Ser: 1.17 mg/dL (ref 0.40–1.50)
GFR: 66.62 mL/min (ref >60.00)
Glucose, Bld: 114 mg/dL — ABNORMAL HIGH (ref 70–99)
Potassium: 4.7 mEq/L (ref 3.5–5.1)
Sodium: 136 mEq/L (ref 135–145)
Total Bilirubin: 0.5 mg/dL (ref 0.2–1.2)
Total Protein: 7.8 g/dL (ref 6.0–8.3)

## 2020-09-16 LAB — CBC
HCT: 44.3 % (ref 39.0–52.0)
Hemoglobin: 14.7 g/dL (ref 13.0–17.0)
MCHC: 33.2 g/dL (ref 30.0–36.0)
MCV: 95.8 fl (ref 78.0–100.0)
Platelets: 282 10*3/uL (ref 150.0–400.0)
RBC: 4.62 Mil/uL (ref 4.22–5.81)
RDW: 13.4 % (ref 11.5–15.5)
WBC: 6.8 10*3/uL (ref 4.0–10.5)

## 2020-09-16 LAB — HEMOGLOBIN A1C: Hgb A1c MFr Bld: 6.2 % (ref 4.6–6.5)

## 2020-09-16 LAB — LIPID PANEL
Cholesterol: 173 mg/dL (ref 0–200)
HDL: 53.2 mg/dL (ref >39.00)
LDL Cholesterol: 98 mg/dL (ref 0–99)
NonHDL: 119.5
Total CHOL/HDL Ratio: 3
Triglycerides: 107 mg/dL (ref 0.0–149.0)
VLDL: 21.4 mg/dL (ref 0.0–40.0)

## 2020-09-16 NOTE — Assessment & Plan Note (Signed)
Seen on CT 11/21 Will get recommended ultrasound

## 2020-09-16 NOTE — Assessment & Plan Note (Signed)
Healthy Has done good work with fitness Colon due again 2027 Defer PSA to next year Recommended COVID booster, flu and shingrix vaccines---he is unsure and will consider

## 2020-09-16 NOTE — Progress Notes (Signed)
Subjective:    Patient ID: Gabriel Sullivan, male    DOB: 1955-07-24, 65 y.o.   MRN: [id number]  HPI Here for physical This visit occurred during the SARS-CoV-2 public health emergency.  Safety protocols were in place, including screening questions prior to the visit, additional usage of staff PPE, and extensive cleaning of exam room while observing appropriate contact time as indicated for disinfecting solutions.   Has been working out and eating better Lost 16# in 1 year  Reviewed CT from 11/21 Has right renal lesion needs further evaluation  Current Outpatient Medications on File Prior to Visit  Medication Sig Dispense Refill   atorvastatin (LIPITOR) 10 MG tablet Take 1 tablet (10 mg total) by mouth every other day. 45 tablet 3   lisinopril-hydrochlorothiazide (ZESTORETIC) 20-12.5 MG tablet Take 1 tablet by mouth daily. 90 tablet 3   Loratadine (CLARITIN PO) Take 1 tablet by mouth daily.     metFORMIN (GLUCOPHAGE) 500 MG tablet Take 1 tablet (500 mg total) by mouth daily with breakfast. 90 tablet 3   Multiple Vitamin (MULTIVITAMIN PO) Take 1 tablet by mouth daily.     Omega-3 Fatty Acids (FISH OIL PO) Take 1 capsule by mouth daily.     omeprazole (PRILOSEC) 20 MG capsule Take 20 mg by mouth daily.     tadalafil (CIALIS) 20 MG tablet Take 1 tablet (20 mg total) by mouth daily as needed. 10 tablet 11   No current facility-administered medications on file prior to visit.    No Known Allergies  Past Medical History:  Diagnosis Date   Anxiety    BPH (benign prostatic hypertrophy)    GERD (gastroesophageal reflux disease)    Hypertension     Past Surgical History:  Procedure Laterality Date   COLONOSCOPY  03/01/2008   "tortuous" , TIC's   CORNEAL TRANSPLANT  2016   DOBUTAMINE STRESS ECHO  2009   negative    Family History  Problem Relation Age of Onset   Arthritis Sister    Stroke Mother    COPD Brother    Diabetes Neg Hx    Hypertension Neg Hx    Heart disease Neg Hx     Cancer Neg Hx    Colon cancer Neg Hx    Colon polyps Neg Hx    Esophageal cancer Neg Hx    Stomach cancer Neg Hx    Rectal cancer Neg Hx     Social History   Socioeconomic History   Marital status: Married    Spouse name: Not on file   Number of children: 4   Years of education: Not on file   Highest education level: Not on file  Occupational History   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    Comment: back on his own  Tobacco Use   Smoking status: Former    Types: Cigarettes    Quit date: 03/12/1986    Years since quitting: 34.5   Smokeless tobacco: Former    Types: Chew    Quit date: 03/01/2011  Vaping Use   Vaping Use: Never used  Substance and Sexual Activity   Alcohol use: Yes    Comment: occ   Drug use: No   Sexual activity: Not on file  Other Topics Concern   Not on file  Social History Narrative   Divorced twice   Remarried 5/21   Social Determinants of Health   Financial Resource Strain: Not on file  Food Insecurity: Not on file  Transportation  Needs: Not on file  Physical Activity: Not on file  Stress: Not on file  Social Connections: Not on file  Intimate Partner Violence: Not on file   Review of Systems  Constitutional:  Negative for fatigue.       Wears seat belt  HENT:  Positive for tinnitus. Negative for dental problem and trouble swallowing.        Wears hearing aides Keeps up with dentist  Eyes:  Negative for visual disturbance.       No diplopia or unilateral vision loss  Respiratory:  Negative for cough, chest tightness and shortness of breath.   Cardiovascular:  Negative for chest pain, palpitations and leg swelling.  Gastrointestinal:  Negative for blood in stool and constipation.       Occ heartburn---rare with omeprazole  Endocrine: Negative for polydipsia and polyuria.  Genitourinary:  Negative for difficulty urinating and urgency.  Musculoskeletal:  Negative for back pain and joint swelling.       Some right knee pain---uses  ibuprofen 600 prn  Skin:  Negative for rash.  Allergic/Immunologic: Positive for environmental allergies. Negative for immunocompromised state.       Loratadine is effective  Neurological:  Negative for dizziness, syncope, light-headedness and headaches.  Hematological:  Negative for adenopathy. Does not bruise/bleed easily.  Psychiatric/Behavioral:  Negative for dysphoric mood and sleep disturbance. The patient is not nervous/anxious.       Objective:   Physical Exam Constitutional:      Appearance: Normal appearance.  HENT:     Right Ear: External ear normal.     Left Ear: External ear normal.     Mouth/Throat:     Pharynx: No oropharyngeal exudate or posterior oropharyngeal erythema.  Eyes:     Conjunctiva/sclera: Conjunctivae normal.     Pupils: Pupils are equal, round, and reactive to light.  Cardiovascular:     Rate and Rhythm: Normal rate and regular rhythm.     Pulses: Normal pulses.     Heart sounds: No murmur heard.   No gallop.  Pulmonary:     Effort: Pulmonary effort is normal.     Breath sounds: Normal breath sounds. No wheezing or rales.  Abdominal:     Palpations: Abdomen is soft.     Tenderness: There is no abdominal tenderness.  Musculoskeletal:     Cervical back: Neck supple.     Right lower leg: No edema.     Left lower leg: No edema.  Lymphadenopathy:     Cervical: No cervical adenopathy.  Skin:    General: Skin is warm.     Findings: No rash.  Neurological:     General: No focal deficit present.     Mental Status: He is alert and oriented to person, place, and time.  Psychiatric:        Mood and Affect: Mood normal.        Behavior: Behavior normal.           Assessment & Plan:

## 2020-09-16 NOTE — Assessment & Plan Note (Signed)
BP Readings from Last 3 Encounters:  09/16/20 108/80  04/09/20 118/82  03/25/20 118/76   Good control on lisinopril/HCTZ

## 2020-09-16 NOTE — Assessment & Plan Note (Signed)
Rare symptoms on the PPI

## 2020-09-16 NOTE — Assessment & Plan Note (Signed)
Hopefully better with weight loss (on metformin)

## 2020-09-18 ENCOUNTER — Ambulatory Visit
Admission: RE | Admit: 2020-09-18 | Discharge: 2020-09-18 | Disposition: A | Discharge status: Home / Self Care | Payer: Federal, State, Local not specified - PPO | Source: Ambulatory Visit | Attending: Internal Medicine | Admitting: Internal Medicine

## 2020-09-18 DIAGNOSIS — N289 Disorder of kidney and ureter, unspecified: Secondary | ICD-10-CM

## 2020-09-18 DIAGNOSIS — N281 Cyst of kidney, acquired: Secondary | ICD-10-CM | POA: Diagnosis not present

## 2020-09-21 ENCOUNTER — Other Ambulatory Visit: Payer: Self-pay | Admitting: Internal Medicine

## 2020-09-21 DIAGNOSIS — N2889 Other specified disorders of kidney and ureter: Secondary | ICD-10-CM

## 2020-09-21 NOTE — Progress Notes (Signed)
Consult ordered.

## 2020-10-30 DIAGNOSIS — N281 Cyst of kidney, acquired: Secondary | ICD-10-CM | POA: Diagnosis not present

## 2020-10-30 DIAGNOSIS — N2889 Other specified disorders of kidney and ureter: Secondary | ICD-10-CM | POA: Diagnosis not present

## 2020-11-11 ENCOUNTER — Other Ambulatory Visit: Payer: Self-pay | Admitting: Urology

## 2020-11-11 DIAGNOSIS — N281 Cyst of kidney, acquired: Secondary | ICD-10-CM

## 2020-11-24 ENCOUNTER — Ambulatory Visit
Admission: RE | Admit: 2020-11-24 | Discharge: 2020-11-24 | Disposition: A | Discharge status: Home / Self Care | Payer: Federal, State, Local not specified - PPO | Source: Ambulatory Visit | Attending: Urology | Admitting: Urology

## 2020-11-24 DIAGNOSIS — N281 Cyst of kidney, acquired: Secondary | ICD-10-CM

## 2020-11-27 ENCOUNTER — Other Ambulatory Visit: Payer: Self-pay | Admitting: Internal Medicine

## 2020-12-11 ENCOUNTER — Other Ambulatory Visit: Payer: Federal, State, Local not specified - PPO

## 2020-12-14 ENCOUNTER — Other Ambulatory Visit: Payer: Self-pay

## 2020-12-14 ENCOUNTER — Ambulatory Visit
Admission: RE | Admit: 2020-12-14 | Discharge: 2020-12-14 | Disposition: A | Discharge status: Home / Self Care | Payer: Federal, State, Local not specified - PPO | Source: Ambulatory Visit | Attending: Urology | Admitting: Urology

## 2020-12-14 DIAGNOSIS — N289 Disorder of kidney and ureter, unspecified: Secondary | ICD-10-CM | POA: Diagnosis not present

## 2020-12-14 MED ORDER — GADOBENATE DIMEGLUMINE 529 MG/ML IV SOLN
20.0000 mL | Freq: Once | INTRAVENOUS | Status: AC | PRN
  Administered 2020-12-14: 20 mL via INTRAVENOUS

## 2020-12-23 DIAGNOSIS — N281 Cyst of kidney, acquired: Secondary | ICD-10-CM | POA: Diagnosis not present

## 2020-12-23 DIAGNOSIS — N5201 Erectile dysfunction due to arterial insufficiency: Secondary | ICD-10-CM | POA: Diagnosis not present

## 2021-01-08 IMAGING — CT CT ANGIO CHEST
2 of 7 series · 18 of 46 positions shown · IV contrast (APPLIED)
Comparison: None.

CLINICAL DATA: Mid back pain, nausea.

EXAM:
CT ANGIOGRAPHY CHEST WITH CONTRAST
TECHNIQUE: Multidetector CT imaging of the chest was performed using the
standard protocol during bolus administration of intravenous
contrast. Multiplanar CT image reconstructions and MIPs were
obtained to evaluate the vascular anatomy.
CONTRAST:  70mL OMNIPAQUE IOHEXOL 350 MG/ML SOLN

[Series 7: thins · axial · 0.71mm/px · z∈[-247,-1]mm · 15 of 396 slices shown]
[im 22/396  lung]
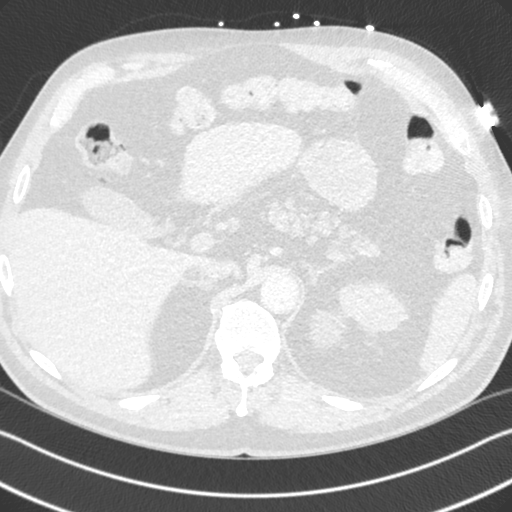
[im 44/396  soft-tissue]
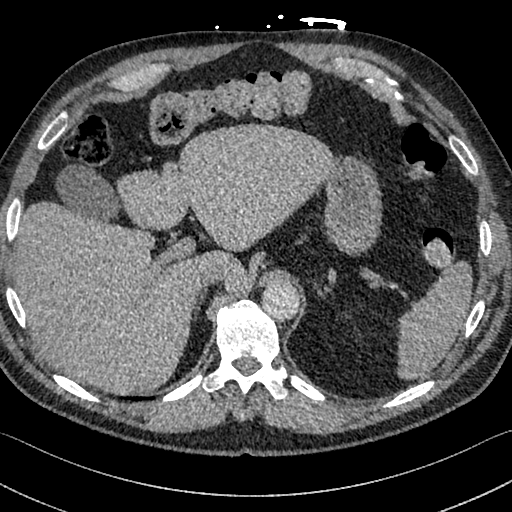
[im 66/396  lung]
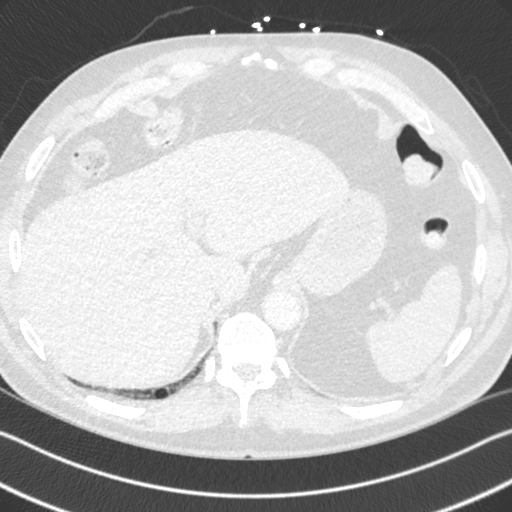
[im 88/396  soft-tissue]
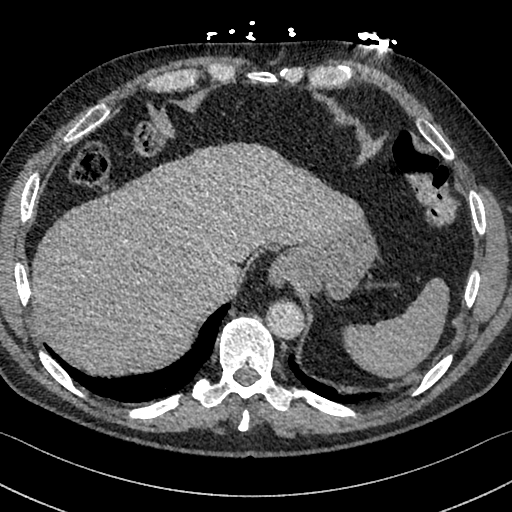
[im 132/396  lung]
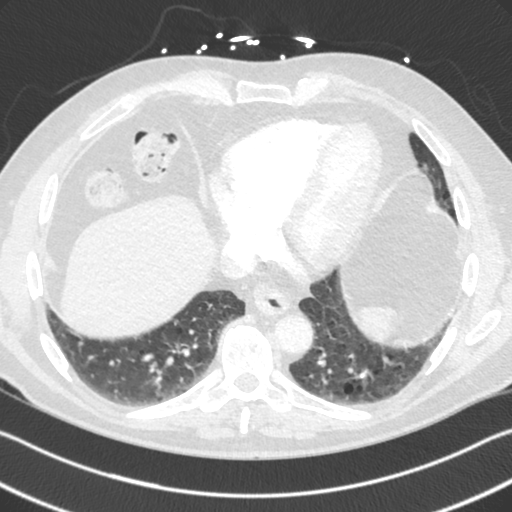
[im 154/396  soft-tissue]
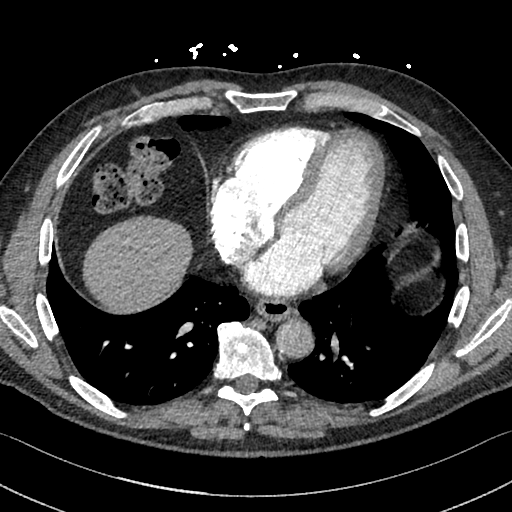
[im 176/396  lung]
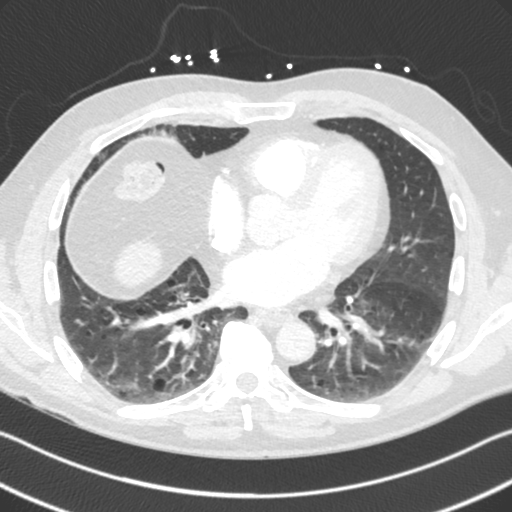
[im 198/396  soft-tissue]
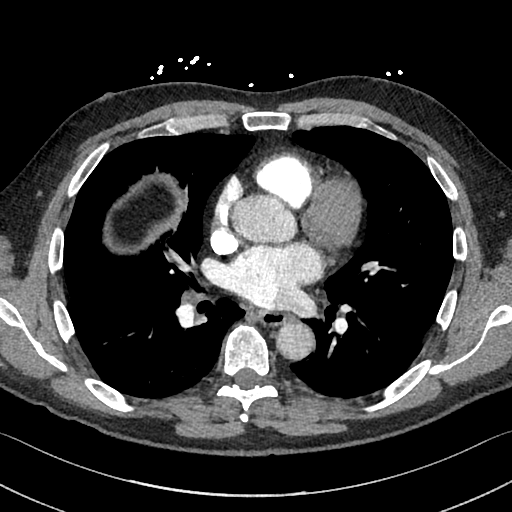
[im 220/396  lung]
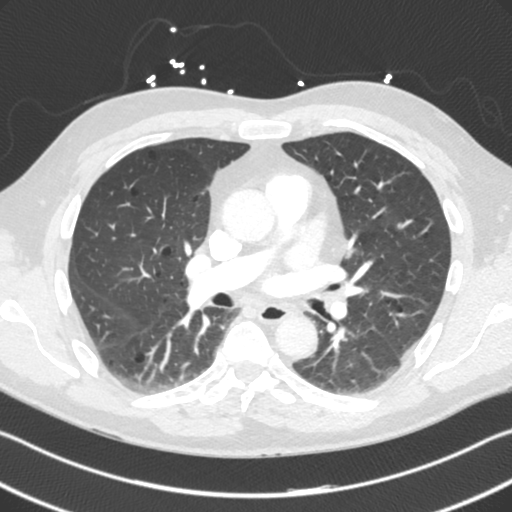
[im 242/396  soft-tissue]
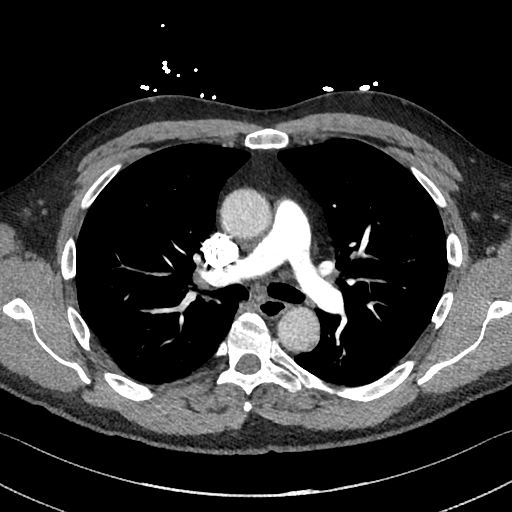
[im 264/396  lung]
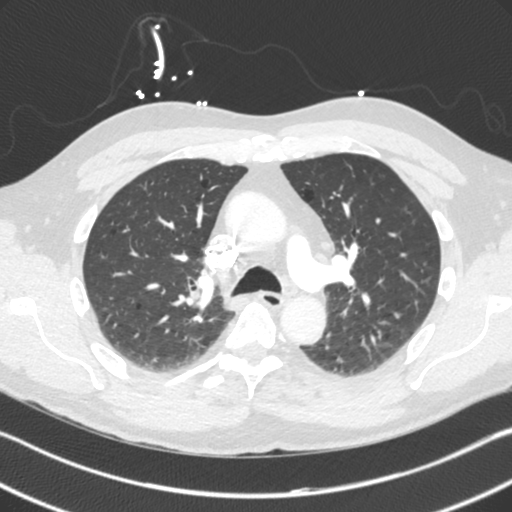
[im 308/396  soft-tissue]
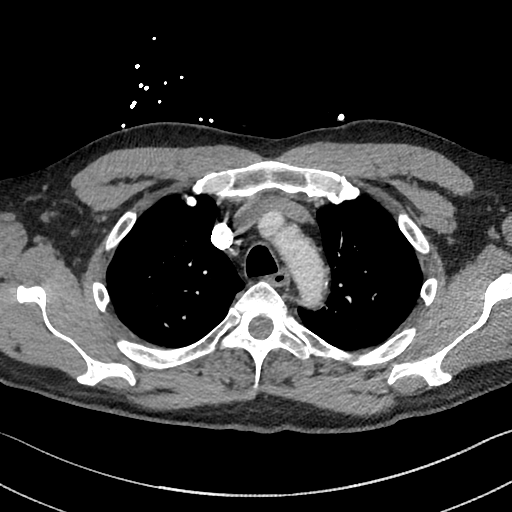
[im 330/396  lung]
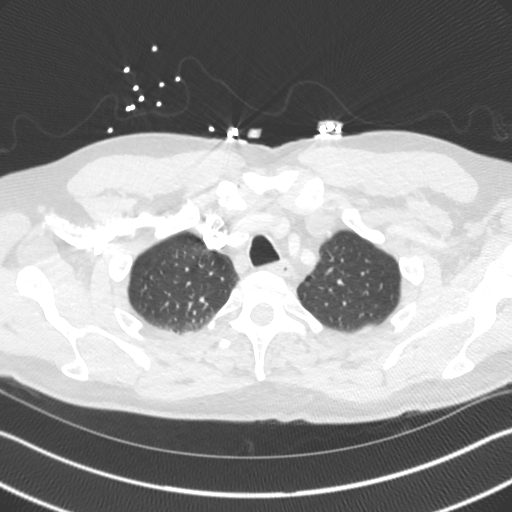
[im 352/396  soft-tissue]
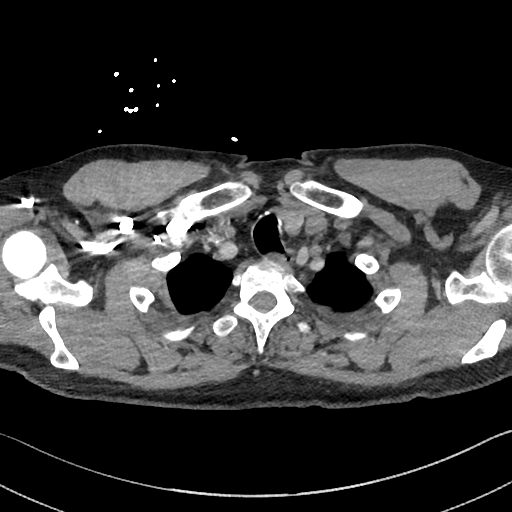
[im 374/396  lung]
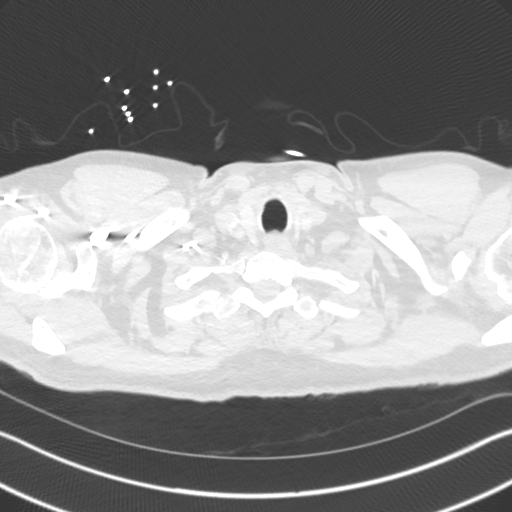

[Series 8: cor · coronal · 0.55mm/px · 3 of 159 slices shown]
[im 40/159  soft-tissue]
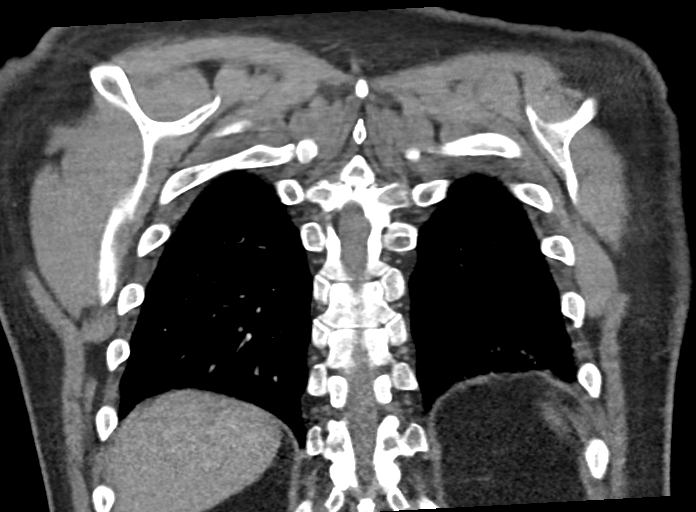
[im 80/159  soft-tissue]
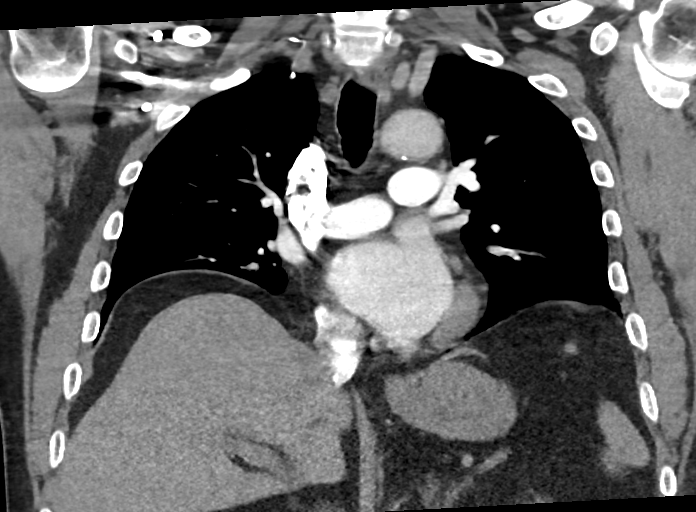
[im 119/159  soft-tissue]
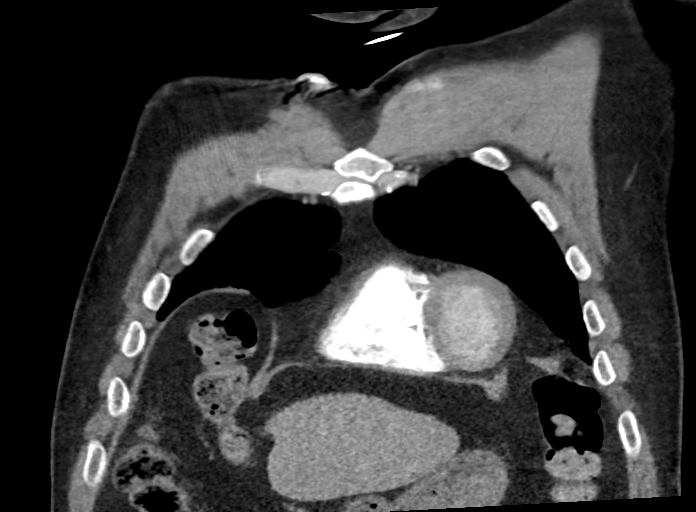

[18 of 46 positions shown; findings below may reference images not displayed]

FINDINGS: Cardiovascular: There is no pulmonary embolism identified within the
main, lobar or segmental pulmonary arteries bilaterally. No thoracic
aortic aneurysm or evidence of aortic dissection. Mild aortic
atherosclerosis. Heart size is within normal limits. No pericardial
effusion. Coronary artery calcifications.

Mediastinum/Nodes: No mass or enlarged lymph nodes seen within the
mediastinum. Esophagus is unremarkable. Trachea and central bronchi
are unremarkable.

Lungs/Pleura: Scattered thin walled cystic foci are seen throughout
both lungs. Mild bibasilar atelectasis/scarring. Lungs otherwise
clear. No evidence of pneumonia or pulmonary edema. No pleural
effusion or pneumothorax.

Upper Abdomen: LEFT renal cyst, incompletely imaged. Limited images
of the upper abdomen are otherwise unremarkable.

Musculoskeletal: No acute or suspicious osseous finding.

Review of the MIP images confirms the above findings.
IMPRESSION: 1. No acute findings. No pulmonary embolism. No aortic aneurysm or
dissection. No pneumonia or pulmonary edema.
2. Scattered small thin walled cystic foci within each lung, most
likely indicating underlying chronic lymphangioleiomyomatosis (MARQUARDT)
or pulmonary Langerhans cell histiocytosis (EG).

Aortic Atherosclerosis ([id number].Y).

## 2021-02-14 ENCOUNTER — Other Ambulatory Visit: Payer: Self-pay | Admitting: Internal Medicine

## 2021-02-16 DIAGNOSIS — E119 Type 2 diabetes mellitus without complications: Secondary | ICD-10-CM | POA: Diagnosis not present

## 2021-02-16 LAB — HM DIABETES EYE EXAM

## 2021-02-25 ENCOUNTER — Other Ambulatory Visit: Payer: Self-pay | Admitting: Internal Medicine

## 2021-07-22 ENCOUNTER — Encounter: Payer: Self-pay | Admitting: Internal Medicine

## 2021-07-23 ENCOUNTER — Encounter: Payer: Self-pay | Admitting: Internal Medicine

## 2021-07-23 ENCOUNTER — Telehealth (INDEPENDENT_AMBULATORY_CARE_PROVIDER_SITE_OTHER): Payer: Federal, State, Local not specified - PPO | Admitting: Internal Medicine

## 2021-07-23 DIAGNOSIS — T148XXA Other injury of unspecified body region, initial encounter: Secondary | ICD-10-CM | POA: Insufficient documentation

## 2021-07-23 MED ORDER — SILVER SULFADIAZINE 1 % EX CREA: 1.0000 | TOPICAL_CREAM | Freq: Two times a day (BID) | CUTANEOUS | 0 refills | Status: DC

## 2021-07-23 NOTE — Assessment & Plan Note (Signed)
Unusual for tick bite---but looks like some bite Has fullness with fluid again No secondary cellulitis  Discussed unearthing skin when blister breaks, and starting silvadene cream daily to bid till healed up

## 2021-07-23 NOTE — Telephone Encounter (Signed)
Spoke to pt. Made VV for 4pm today

## 2021-07-23 NOTE — Progress Notes (Signed)
Subjective:    Patient ID: Gabriel Sullivan, male    DOB: 1955/04/07, 65 y.o.   MRN: [id number]  HPI Video virtual visit due to an insect bite Identification done Discussed limitations and billing and he gave consent Participants--patient is [place] campsite and I am in my office  Was wearing flip flops 9 days ago Thought he saw tick on toe---he pulled it out That night he noticed itching--but just used cortisone Then went to beach 4 days ago---started puffing up with blister It busted and had some yellowish drainage 2 nights ago Blister then recurred Tried some neosporin---and cortisone 10 as well  Current Outpatient Medications on File Prior to Visit  Medication Sig Dispense Refill   atorvastatin (LIPITOR) 10 MG tablet TAKE 1 TABLET BY MOUTH EVERY OTHER DAY 45 tablet 3   lisinopril-hydrochlorothiazide (ZESTORETIC) 20-12.5 MG tablet TAKE 1 TABLET BY MOUTH EVERY DAY 90 tablet 3   Loratadine (CLARITIN PO) Take 1 tablet by mouth daily.     metFORMIN (GLUCOPHAGE) 500 MG tablet TAKE 1 TABLET BY MOUTH EVERY DAY WITH BREAKFAST 90 tablet 3   Multiple Vitamin (MULTIVITAMIN PO) Take 1 tablet by mouth daily.     Omega-3 Fatty Acids (FISH OIL PO) Take 1 capsule by mouth daily.     omeprazole (PRILOSEC) 20 MG capsule Take 20 mg by mouth daily.     No current facility-administered medications on file prior to visit.    No Known Allergies  Past Medical History:  Diagnosis Date   Anxiety    BPH (benign prostatic hypertrophy)    GERD (gastroesophageal reflux disease)    Hypertension     Past Surgical History:  Procedure Laterality Date   COLONOSCOPY  03/01/2008   "tortuous" , TIC's   CORNEAL TRANSPLANT  2016   DOBUTAMINE STRESS ECHO  2009   negative    Family History  Problem Relation Age of Onset   Arthritis Sister    Stroke Mother    COPD Brother    Diabetes Neg Hx    Hypertension Neg Hx    Heart disease Neg Hx    Cancer Neg Hx    Colon cancer Neg Hx    Colon polyps Neg Hx     Esophageal cancer Neg Hx    Stomach cancer Neg Hx    Rectal cancer Neg Hx     Social History   Socioeconomic History   Marital status: Married    Spouse name: Not on file   Number of children: 4   Years of education: Not on file   Highest education level: Not on file  Occupational History   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    Comment: back on his own  Tobacco Use   Smoking status: Former    Types: Cigarettes    Quit date: 03/12/1986    Years since quitting: 35.3   Smokeless tobacco: Former    Types: Chew    Quit date: 03/01/2011  Vaping Use   Vaping Use: Never used  Substance and Sexual Activity   Alcohol use: Yes    Comment: occ   Drug use: No   Sexual activity: Not on file  Other Topics Concern   Not on file  Social History Narrative   Divorced twice   Remarried 5/21   Social Determinants of Health   Financial Resource Strain: Not on file  Food Insecurity: Not on file  Transportation Needs: Not on file  Physical Activity: Not on file  Stress: Not  on file  Social Connections: Not on file  Intimate Partner Violence: Not on file   Review of Systems No fever Feels fine    Objective:   Physical Exam Constitutional:      Appearance: Normal appearance.  Skin:    Comments: Left 3rd toe has blister (~65m) on dorsum of proximal phalanx Slight red ring at base of blister but no surrounding redness  Neurological:     Mental Status: He is alert.            Assessment & Plan:

## 2021-08-20 ENCOUNTER — Other Ambulatory Visit: Payer: Self-pay | Admitting: Internal Medicine

## 2021-09-16 ENCOUNTER — Other Ambulatory Visit: Payer: Federal, State, Local not specified - PPO

## 2021-09-18 ENCOUNTER — Encounter: Payer: Federal, State, Local not specified - PPO | Admitting: Internal Medicine

## 2021-09-23 ENCOUNTER — Encounter: Payer: Self-pay | Admitting: Internal Medicine

## 2021-09-23 ENCOUNTER — Ambulatory Visit (INDEPENDENT_AMBULATORY_CARE_PROVIDER_SITE_OTHER): Payer: Federal, State, Local not specified - PPO | Admitting: Internal Medicine

## 2021-09-23 VITALS — BP 138/86 | HR 60 | Temp 97.5°F | Ht 69.75 in | Wt 203.0 lb

## 2021-09-23 DIAGNOSIS — R7303 Prediabetes: Secondary | ICD-10-CM

## 2021-09-23 DIAGNOSIS — I10 Essential (primary) hypertension: Secondary | ICD-10-CM | POA: Diagnosis not present

## 2021-09-23 DIAGNOSIS — Z0000 Encounter for general adult medical examination without abnormal findings: Secondary | ICD-10-CM

## 2021-09-23 DIAGNOSIS — M79605 Pain in left leg: Secondary | ICD-10-CM | POA: Diagnosis not present

## 2021-09-23 DIAGNOSIS — Z125 Encounter for screening for malignant neoplasm of prostate: Secondary | ICD-10-CM | POA: Diagnosis not present

## 2021-09-23 DIAGNOSIS — K219 Gastro-esophageal reflux disease without esophagitis: Secondary | ICD-10-CM | POA: Diagnosis not present

## 2021-09-23 LAB — COMPREHENSIVE METABOLIC PANEL
ALT: 16 U/L (ref 0–53)
AST: 17 U/L (ref 0–37)
Albumin: 4.4 g/dL (ref 3.5–5.2)
Alkaline Phosphatase: 71 U/L (ref 39–117)
BUN: 17 mg/dL (ref 6–23)
CO2: 26 mEq/L (ref 19–32)
Calcium: 9.8 mg/dL (ref 8.4–10.5)
Chloride: 101 mEq/L (ref 96–112)
Creatinine, Ser: 1.05 mg/dL (ref 0.40–1.50)
GFR: 75.32 mL/min (ref >60.00)
Glucose, Bld: 119 mg/dL — ABNORMAL HIGH (ref 70–99)
Potassium: 4.7 mEq/L (ref 3.5–5.1)
Sodium: 137 mEq/L (ref 135–145)
Total Bilirubin: 0.6 mg/dL (ref 0.2–1.2)
Total Protein: 7.8 g/dL (ref 6.0–8.3)

## 2021-09-23 LAB — CBC
HCT: 42.9 % (ref 39.0–52.0)
Hemoglobin: 14.1 g/dL (ref 13.0–17.0)
MCHC: 33 g/dL (ref 30.0–36.0)
MCV: 96 fl (ref 78.0–100.0)
Platelets: 342 10*3/uL (ref 150.0–400.0)
RBC: 4.46 Mil/uL (ref 4.22–5.81)
RDW: 13.9 % (ref 11.5–15.5)
WBC: 7.8 10*3/uL (ref 4.0–10.5)

## 2021-09-23 LAB — LIPID PANEL
Cholesterol: 167 mg/dL (ref 0–200)
HDL: 62 mg/dL (ref >39.00)
LDL Cholesterol: 86 mg/dL (ref 0–99)
NonHDL: 105.39
Total CHOL/HDL Ratio: 3
Triglycerides: 96 mg/dL (ref 0.0–149.0)
VLDL: 19.2 mg/dL (ref 0.0–40.0)

## 2021-09-23 LAB — PSA: PSA: 0.79 ng/mL (ref 0.10–4.00)

## 2021-09-23 LAB — HEMOGLOBIN A1C: Hgb A1c MFr Bld: 6.1 % (ref 4.6–6.5)

## 2021-09-23 NOTE — Assessment & Plan Note (Signed)
Healthy Limited exercise due to leg Colon due 2027 Will check PSA Doesn't want shingrix Will consider updated COVID and flu vaccines

## 2021-09-23 NOTE — Assessment & Plan Note (Signed)
Quiet with daily omeprazole

## 2021-09-23 NOTE — Assessment & Plan Note (Signed)
Findings suggest bad hip arthritis Could be radiculopathy Will set up with Dr Ninfa Linden

## 2021-09-23 NOTE — Assessment & Plan Note (Signed)
Hopefully still controlled with metformin 500 daily

## 2021-09-23 NOTE — Progress Notes (Signed)
Subjective:    Patient ID: Gabriel Sullivan, male    DOB: 08-Jan-1956, 66 y.o.   MRN: [id number]  HPI Here for physical  Started with left back pain--and now going down inside of left thigh and down leg to foot Started about a month ago All the time---sleeps with pillow to elevated leg Has a limp----pain when going down stairs but not up Not exercising now Has improved considerably Ibuprofen does help--using prn ('600mg'$ ) No change in bowel or bladder control No saddle sensory changes  Current Outpatient Medications on File Prior to Visit  Medication Sig Dispense Refill   atorvastatin (LIPITOR) 10 MG tablet TAKE 1 TABLET BY MOUTH EVERY OTHER DAY 45 tablet 0   lisinopril-hydrochlorothiazide (ZESTORETIC) 20-12.5 MG tablet TAKE 1 TABLET BY MOUTH EVERY DAY 90 tablet 3   Loratadine (CLARITIN PO) Take 1 tablet by mouth daily.     metFORMIN (GLUCOPHAGE) 500 MG tablet TAKE 1 TABLET BY MOUTH EVERY DAY WITH BREAKFAST 90 tablet 3   Multiple Vitamin (MULTIVITAMIN PO) Take 1 tablet by mouth daily.     Omega-3 Fatty Acids (FISH OIL PO) Take 1 capsule by mouth daily.     omeprazole (PRILOSEC) 20 MG capsule Take 20 mg by mouth daily.     No current facility-administered medications on file prior to visit.    No Known Allergies  Past Medical History:  Diagnosis Date   Anxiety    BPH (benign prostatic hypertrophy)    GERD (gastroesophageal reflux disease)    Hypertension     Past Surgical History:  Procedure Laterality Date   COLONOSCOPY  03/01/2008   "tortuous" , TIC's   CORNEAL TRANSPLANT  2016   DOBUTAMINE STRESS ECHO  2009   negative    Family History  Problem Relation Age of Onset   Arthritis Sister    Stroke Mother    COPD Brother    Diabetes Neg Hx    Hypertension Neg Hx    Heart disease Neg Hx    Cancer Neg Hx    Colon cancer Neg Hx    Colon polyps Neg Hx    Esophageal cancer Neg Hx    Stomach cancer Neg Hx    Rectal cancer Neg Hx     Social History   Socioeconomic  History   Marital status: Married    Spouse name: Not on file   Number of children: 4   Years of education: Not on file   Highest education level: Not on file  Occupational History   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    Comment: back on his own  Tobacco Use   Smoking status: Former    Types: Cigarettes    Quit date: 03/12/1986    Years since quitting: 35.5    Passive exposure: Past   Smokeless tobacco: Former    Types: Chew    Quit date: 03/01/2011  Vaping Use   Vaping Use: Never used  Substance and Sexual Activity   Alcohol use: Yes    Comment: occ   Drug use: No   Sexual activity: Not on file  Other Topics Concern   Not on file  Social History Narrative   Divorced twice   Remarried 5/21   Social Determinants of Health   Financial Resource Strain: Not on file  Food Insecurity: Not on file  Transportation Needs: Not on file  Physical Activity: Not on file  Stress: Not on file  Social Connections: Not on file  Intimate Partner Violence:  Not on file   Review of Systems  Constitutional:  Negative for fatigue and unexpected weight change.       Wears seat belt  HENT:  Positive for tinnitus. Negative for dental problem.        Keeps up with dentist Hearing aides help  Eyes:  Negative for visual disturbance.       No diplopia or unilateral vision loss  Respiratory:  Negative for cough, chest tightness and shortness of breath.   Gastrointestinal:  Negative for blood in stool and constipation.       No heartburn  Endocrine: Negative for polydipsia and polyuria.  Genitourinary:  Negative for difficulty urinating and urgency.       Some ED-- not excited about the meds  Musculoskeletal:  Positive for back pain. Negative for joint swelling.       Some hand stiffness  Allergic/Immunologic: Positive for environmental allergies. Negative for immunocompromised state.       Loratadine helps  Neurological:  Negative for dizziness, syncope, light-headedness and headaches.   Hematological:  Negative for adenopathy. Does not bruise/bleed easily.  Psychiatric/Behavioral:  Negative for dysphoric mood. The patient is not nervous/anxious.        Some sleep problems from the leg pain       Objective:   Physical Exam Constitutional:      Appearance: Normal appearance.  HENT:     Mouth/Throat:     Pharynx: No oropharyngeal exudate or posterior oropharyngeal erythema.  Eyes:     Conjunctiva/sclera: Conjunctivae normal.     Pupils: Pupils are equal, round, and reactive to light.  Cardiovascular:     Rate and Rhythm: Normal rate and regular rhythm.     Pulses: Normal pulses.     Heart sounds: No murmur heard.    No gallop.  Pulmonary:     Effort: Pulmonary effort is normal.     Breath sounds: Normal breath sounds. No wheezing or rales.  Abdominal:     Palpations: Abdomen is soft.     Tenderness: There is no abdominal tenderness.  Musculoskeletal:     Cervical back: Neck supple.     Right lower leg: No edema.     Left lower leg: No edema.     Comments: No internal rotation and it recreates his pain  Lymphadenopathy:     Cervical: No cervical adenopathy.  Skin:    Findings: No lesion or rash.  Neurological:     Mental Status: He is alert and oriented to person, place, and time.     Comments: Antalgic gait. Some left leg weakness  Psychiatric:        Mood and Affect: Mood normal.        Behavior: Behavior normal.            Assessment & Plan:

## 2021-09-23 NOTE — Assessment & Plan Note (Signed)
BP Readings from Last 3 Encounters:  09/23/21 138/86  09/16/20 108/80  04/09/20 118/82   Controlled with lisinopril/HCTZ 20/12.5

## 2021-10-12 ENCOUNTER — Ambulatory Visit: Payer: Federal, State, Local not specified - PPO | Admitting: Orthopaedic Surgery

## 2021-10-12 ENCOUNTER — Ambulatory Visit (INDEPENDENT_AMBULATORY_CARE_PROVIDER_SITE_OTHER): Payer: Federal, State, Local not specified - PPO

## 2021-10-12 DIAGNOSIS — M25552 Pain in left hip: Secondary | ICD-10-CM

## 2021-10-12 NOTE — Progress Notes (Signed)
The patient is a very pleasant and active 66 year old gentleman sent from Dr. Silvio Pate to evaluate and treat 2 months of worsening left hip pain.  He actually says this started to feel better after going on a bicycle trip.  He does report stiffness in that left hip and some pain in the groin.  He denies any specific injuries.  He is a prediabetic.  He has been taking over-the-counter Advil and this has helped.  Examination of both hips actually does show stiffness with internal and external rotation which is worse on the left than the right.  The left side is the 1 that hurts in the worst.  An AP pelvis and lateral left hip shows evidence of femoral acetabular impingement.  There is superior lateral joint space narrowing and slight flattening of the femoral head.  I showed the patient a hip model and his x-rays and and explained what this means.  It has only been going on for 2 months recommended hip strengthening exercises and anti-inflammatories with activity modification.  The next step would be considering an intra-articular steroid injection likely by Dr. Rolena Infante under ultrasound.  The patient understands if this gets bad enough he will let us know.  All questions and concerns were answered and addressed.  Follow-up is as needed.

## 2022-01-11 ENCOUNTER — Ambulatory Visit: Payer: Federal, State, Local not specified - PPO | Admitting: Family Medicine

## 2022-01-11 ENCOUNTER — Encounter: Payer: Self-pay | Admitting: Family Medicine

## 2022-01-11 VITALS — BP 124/80 | HR 78 | Temp 98.4°F | Resp 16 | Ht 69.7 in | Wt 203.5 lb

## 2022-01-11 DIAGNOSIS — B029 Zoster without complications: Secondary | ICD-10-CM

## 2022-01-11 MED ORDER — VALACYCLOVIR HCL 1 G PO TABS: 1000.0000 mg | ORAL_TABLET | Freq: Three times a day (TID) | ORAL | 0 refills | Status: AC

## 2022-01-11 MED ORDER — GABAPENTIN 100 MG PO CAPS: 100.0000 mg | ORAL_CAPSULE | Freq: Three times a day (TID) | ORAL | 3 refills | Status: DC

## 2022-01-11 NOTE — Progress Notes (Unsigned)
   Subjective:    Patient ID: Gabriel Sullivan, male    DOB: April 29, 1955, 66 y.o.   MRN: [id number]  HPI Painful rash- pt reports he developed tingling sensation over his L leg.  Subsequently developed pain of L thigh, lower abd pain.  Yesterday developed rash.  Sxs stop at midline.  Only on L side.  No hx of similar.  Did have chicken pox as a child.  Has not had shingles vaccine.   Review of Systems For ROS see HPI     Objective:   Physical Exam Vitals reviewed.  Constitutional:      General: He is not in acute distress.    Appearance: Normal appearance. He is not ill-appearing.  HENT:     Head: Normocephalic and atraumatic.  Skin:    General: Skin is warm and dry.     Findings: Rash (vesicular rash along L L1 dermatome) present.  Neurological:     General: No focal deficit present.     Mental Status: He is alert and oriented to person, place, and time.  Psychiatric:        Mood and Affect: Mood normal.        Behavior: Behavior normal.        Thought Content: Thought content normal.           Assessment & Plan:   Shingles- new.  Start Valtrex to combat virus and start gabapentin for pain relief.  Reviewed supportive care and red flags that should prompt return.  Pt expressed understanding and is in agreement w/ plan.

## 2022-01-11 NOTE — Patient Instructions (Signed)
Follow up as needed or as scheduled START the Valtrex 3x/day x7 days to help shorten the duration and improve the severity of symptoms USE the Gabapentin as needed for burning/shooting nerve pain Continue to use creams for itching/burning- Cortisone or the cream you have at home Call with any questions or concerns Hang in there! Happy Holidays!!!

## 2022-02-06 ENCOUNTER — Other Ambulatory Visit: Payer: Self-pay | Admitting: Internal Medicine

## 2022-02-14 ENCOUNTER — Other Ambulatory Visit: Payer: Self-pay | Admitting: Internal Medicine

## 2022-03-11 ENCOUNTER — Ambulatory Visit: Payer: Federal, State, Local not specified - PPO | Admitting: Internal Medicine

## 2022-03-11 ENCOUNTER — Encounter: Payer: Self-pay | Admitting: Internal Medicine

## 2022-03-11 VITALS — BP 122/86 | HR 74 | Temp 97.3°F | Ht 69.75 in | Wt 212.0 lb

## 2022-03-11 DIAGNOSIS — M791 Myalgia, unspecified site: Secondary | ICD-10-CM

## 2022-03-11 LAB — RENAL FUNCTION PANEL
Albumin: 4.3 g/dL (ref 3.5–5.2)
BUN: 21 mg/dL (ref 6–23)
CO2: 28 mEq/L (ref 19–32)
Calcium: 9.8 mg/dL (ref 8.4–10.5)
Chloride: 100 mEq/L (ref 96–112)
Creatinine, Ser: 0.99 mg/dL (ref 0.40–1.50)
GFR: 80.57 mL/min (ref >60.00)
Glucose, Bld: 122 mg/dL — ABNORMAL HIGH (ref 70–99)
Phosphorus: 2.6 mg/dL (ref 2.3–4.6)
Potassium: 4.7 mEq/L (ref 3.5–5.1)
Sodium: 136 mEq/L (ref 135–145)

## 2022-03-11 LAB — CBC
HCT: 40.9 % (ref 39.0–52.0)
Hemoglobin: 13.9 g/dL (ref 13.0–17.0)
MCHC: 33.9 g/dL (ref 30.0–36.0)
MCV: 96.8 fl (ref 78.0–100.0)
Platelets: 355 10*3/uL (ref 150.0–400.0)
RBC: 4.23 Mil/uL (ref 4.22–5.81)
RDW: 13.4 % (ref 11.5–15.5)
WBC: 8.7 10*3/uL (ref 4.0–10.5)

## 2022-03-11 LAB — MAGNESIUM: Magnesium: 2 mg/dL (ref 1.5–2.5)

## 2022-03-11 LAB — CK: Total CK: 68 U/L (ref 7–232)

## 2022-03-11 LAB — SEDIMENTATION RATE: Sed Rate: 16 mm/hr (ref 0–20)

## 2022-03-11 NOTE — Assessment & Plan Note (Signed)
Most consistent with statin myopathy No upper body symptoms ---so not consistent with PMR Is on magnesium Will check CPK, sed rate, chemistries Stay off the atorvastatin for now If labs normal, and symptoms improve, could consider trying rosuvastatin 67m

## 2022-03-11 NOTE — Progress Notes (Signed)
Subjective:    Patient ID: Gabriel Sullivan, male    DOB: 1955-01-28, 67 y.o.   MRN: [id number]  HPI Here due to leg pain  Wonders if this is due to shingles about 2 months ago After the lesions cleared---he noticed pain down both legs (thigh and calf) Worse and feels cramping if standing for a while Usually better with walking Has sense of weakness when first standing Trouble crossing legs  He stopped the atorvastatin a week ago Was taking it every other day  2 tylenol and 4 ibuprofen in the morning hold him till the evening  The prior hip/leg pain he had on the left--it is better May need THR in the future  Current Outpatient Medications on File Prior to Visit  Medication Sig Dispense Refill   gabapentin (NEURONTIN) 100 MG capsule Take 1 capsule (100 mg total) by mouth 3 (three) times daily. As needed for burning pain 90 capsule 3   lisinopril-hydrochlorothiazide (ZESTORETIC) 20-12.5 MG tablet TAKE 1 TABLET BY MOUTH EVERY DAY 90 tablet 3   Loratadine (CLARITIN PO) Take 1 tablet by mouth daily.     MAGNESIUM GLYCINATE PO Take by mouth.     metFORMIN (GLUCOPHAGE) 500 MG tablet TAKE 1 TABLET BY MOUTH EVERY DAY WITH BREAKFAST 90 tablet 3   Multiple Vitamin (MULTIVITAMIN PO) Take 1 tablet by mouth daily.     Omega-3 Fatty Acids (FISH OIL PO) Take 1 capsule by mouth daily.     omeprazole (PRILOSEC) 20 MG capsule Take 20 mg by mouth daily.     atorvastatin (LIPITOR) 10 MG tablet TAKE 1 TABLET BY MOUTH EVERY OTHER DAY (Patient not taking: Reported on 03/11/2022) 45 tablet 3   No current facility-administered medications on file prior to visit.    No Known Allergies  Past Medical History:  Diagnosis Date   Anxiety    BPH (benign prostatic hypertrophy)    GERD (gastroesophageal reflux disease)    Hypertension     Past Surgical History:  Procedure Laterality Date   COLONOSCOPY  03/01/2008   "tortuous" , TIC's   CORNEAL TRANSPLANT  2016   DOBUTAMINE STRESS ECHO  2009    negative    Family History  Problem Relation Age of Onset   Arthritis Sister    Stroke Mother    COPD Brother    Diabetes Neg Hx    Hypertension Neg Hx    Heart disease Neg Hx    Cancer Neg Hx    Colon cancer Neg Hx    Colon polyps Neg Hx    Esophageal cancer Neg Hx    Stomach cancer Neg Hx    Rectal cancer Neg Hx     Social History   Socioeconomic History   Marital status: Married    Spouse name: Not on file   Number of children: 4   Years of education: Not on file   Highest education level: Not on file  Occupational History   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    Comment: back on his own  Tobacco Use   Smoking status: Former    Types: Cigarettes    Quit date: 03/12/1986    Years since quitting: 36.0    Passive exposure: Past   Smokeless tobacco: Former    Types: Chew    Quit date: 03/01/2011  Vaping Use   Vaping Use: Never used  Substance and Sexual Activity   Alcohol use: Yes    Comment: occ   Drug use: No  Sexual activity: Not on file  Other Topics Concern   Not on file  Social History Narrative   Divorced twice   Remarried 5/21   Social Determinants of Health   Financial Resource Strain: Not on file  Food Insecurity: Not on file  Transportation Needs: Not on file  Physical Activity: Not on file  Stress: Not on file  Social Connections: Not on file  Intimate Partner Violence: Not on file   Review of Systems Lso with pain in left 2nd and 3rd finger    Objective:   Physical Exam Constitutional:      Appearance: Normal appearance.  Musculoskeletal:     Comments: Limited external rotation in both hips--but otherwise move okay  Neurological:     Mental Status: He is alert.     Comments: Mildly antalgic gait No leg weakness            Assessment & Plan:

## 2022-03-31 DIAGNOSIS — E119 Type 2 diabetes mellitus without complications: Secondary | ICD-10-CM | POA: Diagnosis not present

## 2022-09-09 ENCOUNTER — Encounter (INDEPENDENT_AMBULATORY_CARE_PROVIDER_SITE_OTHER): Payer: Self-pay

## 2022-09-28 ENCOUNTER — Encounter: Payer: Federal, State, Local not specified - PPO | Admitting: Internal Medicine

## 2022-10-05 ENCOUNTER — Encounter: Payer: Self-pay | Admitting: Internal Medicine

## 2022-10-05 ENCOUNTER — Ambulatory Visit (INDEPENDENT_AMBULATORY_CARE_PROVIDER_SITE_OTHER): Payer: Federal, State, Local not specified - PPO | Admitting: Internal Medicine

## 2022-10-05 VITALS — BP 138/84 | HR 64 | Temp 97.7°F | Ht 69.5 in | Wt 206.0 lb

## 2022-10-05 DIAGNOSIS — Z125 Encounter for screening for malignant neoplasm of prostate: Secondary | ICD-10-CM

## 2022-10-05 DIAGNOSIS — R7303 Prediabetes: Secondary | ICD-10-CM | POA: Diagnosis not present

## 2022-10-05 DIAGNOSIS — I10 Essential (primary) hypertension: Secondary | ICD-10-CM | POA: Diagnosis not present

## 2022-10-05 DIAGNOSIS — K219 Gastro-esophageal reflux disease without esophagitis: Secondary | ICD-10-CM

## 2022-10-05 DIAGNOSIS — I700 Atherosclerosis of aorta: Secondary | ICD-10-CM

## 2022-10-05 DIAGNOSIS — Z0000 Encounter for general adult medical examination without abnormal findings: Secondary | ICD-10-CM | POA: Diagnosis not present

## 2022-10-05 LAB — HEPATIC FUNCTION PANEL
ALT: 18 U/L (ref 0–53)
AST: 16 U/L (ref 0–37)
Albumin: 4.2 g/dL (ref 3.5–5.2)
Alkaline Phosphatase: 56 U/L (ref 39–117)
Bilirubin, Direct: 0.1 mg/dL (ref 0.0–0.3)
Total Bilirubin: 0.4 mg/dL (ref 0.2–1.2)
Total Protein: 7.6 g/dL (ref 6.0–8.3)

## 2022-10-05 LAB — LIPID PANEL
Cholesterol: 194 mg/dL (ref 0–200)
HDL: 57 mg/dL (ref >39.00)
LDL Cholesterol: 106 mg/dL — ABNORMAL HIGH (ref 0–99)
NonHDL: 137.07
Total CHOL/HDL Ratio: 3
Triglycerides: 155 mg/dL — ABNORMAL HIGH (ref 0.0–149.0)
VLDL: 31 mg/dL (ref 0.0–40.0)

## 2022-10-05 LAB — CBC
HCT: 43.3 % (ref 39.0–52.0)
Hemoglobin: 14.2 g/dL (ref 13.0–17.0)
MCHC: 32.8 g/dL (ref 30.0–36.0)
MCV: 98.3 fl (ref 78.0–100.0)
Platelets: 310 10*3/uL (ref 150.0–400.0)
RBC: 4.41 Mil/uL (ref 4.22–5.81)
RDW: 13.7 % (ref 11.5–15.5)
WBC: 9.1 10*3/uL (ref 4.0–10.5)

## 2022-10-05 LAB — HEMOGLOBIN A1C: Hgb A1c MFr Bld: 5.8 % (ref 4.6–6.5)

## 2022-10-05 LAB — RENAL FUNCTION PANEL
Albumin: 4.2 g/dL (ref 3.5–5.2)
BUN: 21 mg/dL (ref 6–23)
CO2: 28 meq/L (ref 19–32)
Calcium: 10 mg/dL (ref 8.4–10.5)
Chloride: 102 meq/L (ref 96–112)
Creatinine, Ser: 1.12 mg/dL (ref 0.40–1.50)
GFR: 69.2 mL/min (ref >60.00)
Glucose, Bld: 106 mg/dL — ABNORMAL HIGH (ref 70–99)
Phosphorus: 3.5 mg/dL (ref 2.3–4.6)
Potassium: 5.5 meq/L — ABNORMAL HIGH (ref 3.5–5.1)
Sodium: 136 meq/L (ref 135–145)

## 2022-10-05 LAB — PSA: PSA: 0.73 ng/mL (ref 0.10–4.00)

## 2022-10-05 MED ORDER — TADALAFIL 20 MG PO TABS: 10.0000 mg | ORAL_TABLET | ORAL | 11 refills | Status: AC | PRN

## 2022-10-05 MED ORDER — ROSUVASTATIN CALCIUM 5 MG PO TABS: 5.0000 mg | ORAL_TABLET | ORAL | 3 refills | Status: DC

## 2022-10-05 NOTE — Assessment & Plan Note (Signed)
Quiet on the omeprazole ---will continue daily since takes ibuprofen

## 2022-10-05 NOTE — Assessment & Plan Note (Signed)
Healthy Getting back to exercise Colon due 2027 Will check PSA Still prefers no vaccines

## 2022-10-05 NOTE — Progress Notes (Signed)
Subjective:    Patient ID: Gabriel Sullivan, male    DOB: [birth year], 67 y.o.   MRN: [id number]  HPI Here for physical  Still has aching---moves around in different areas Stopping the cholesterol medication didn't resolve it--but things were better (not sure if that was the issue) Stiff after sitting Did see ortho about the hip--not ready for THR Takes tylenol arthritis bidand ibuprofen 400 bid--keeps him moving  Stays busy at work Not really exercising due to pain---plans to get back to the gym Rides bike  Current Outpatient Medications on File Prior to Visit  Medication Sig Dispense Refill   acetaminophen (TYLENOL) 650 MG CR tablet Take 650 mg by mouth every 8 (eight) hours as needed for pain.     ibuprofen (ADVIL) 200 MG tablet Take 200 mg by mouth every 6 (six) hours as needed.     lisinopril-hydrochlorothiazide (ZESTORETIC) 20-12.5 MG tablet TAKE 1 TABLET BY MOUTH EVERY DAY 90 tablet 3   Loratadine (CLARITIN PO) Take 1 tablet by mouth daily.     MAGNESIUM GLYCINATE PO Take by mouth.     metFORMIN (GLUCOPHAGE) 500 MG tablet TAKE 1 TABLET BY MOUTH EVERY DAY WITH BREAKFAST 90 tablet 3   Multiple Vitamin (MULTIVITAMIN PO) Take 1 tablet by mouth daily.     Omega-3 Fatty Acids (FISH OIL PO) Take 1 capsule by mouth daily.     omeprazole (PRILOSEC) 20 MG capsule Take 20 mg by mouth daily.     TURMERIC PO Take by mouth.     No current facility-administered medications on file prior to visit.    No Known Allergies  Past Medical History:  Diagnosis Date   Anxiety    BPH (benign prostatic hypertrophy)    GERD (gastroesophageal reflux disease)    Hypertension     Past Surgical History:  Procedure Laterality Date   COLONOSCOPY  03/01/2008   "tortuous" , TIC's   CORNEAL TRANSPLANT  2016   DOBUTAMINE STRESS ECHO  2009   negative    Family History  Problem Relation Age of Onset   Arthritis Sister    Stroke Mother    COPD Brother    Diabetes Neg Hx    Hypertension Neg Hx     Heart disease Neg Hx    Cancer Neg Hx    Colon cancer Neg Hx    Colon polyps Neg Hx    Esophageal cancer Neg Hx    Stomach cancer Neg Hx    Rectal cancer Neg Hx     Social History   Socioeconomic History   Marital status: Married    Spouse name: Not on file   Number of children: 4   Years of education: Not on file   Highest education level: Not on file  Occupational History   Occupation: Airline pilot for doors and hardware    Comment: back on his own  Tobacco Use   Smoking status: Former    Current packs/day: 0.00    Types: Cigarettes    Quit date: 03/12/1986    Years since quitting: 36.5    Passive exposure: Past   Smokeless tobacco: Former    Types: Chew    Quit date: 03/01/2011  Vaping Use   Vaping status: Never Used  Substance and Sexual Activity   Alcohol use: Yes    Comment: occ   Drug use: No   Sexual activity: Not on file  Other Topics Concern   Not on file  Social History Narrative   Divorced  twice   Remarried 5/21   Social Determinants of Health   Financial Resource Strain: Not on file  Food Insecurity: Not on file  Transportation Needs: Not on file  Physical Activity: Not on file  Stress: Not on file  Social Connections: Not on file  Intimate Partner Violence: Not on file   Review of Systems  Constitutional:  Negative for fatigue and unexpected weight change.       Wears seat belt  HENT:  Positive for hearing loss and tinnitus. Negative for dental problem.        Hearing aides Keeps up with dentist  Eyes:  Negative for visual disturbance.       No diplopia or unilateral vision loss  Respiratory:  Negative for cough, chest tightness and shortness of breath.   Cardiovascular:  Negative for chest pain, palpitations and leg swelling.  Gastrointestinal:  Negative for blood in stool and constipation.       No heartburn  Endocrine: Negative for polydipsia and polyuria.  Genitourinary:  Negative for difficulty urinating and urgency.       Some  ED---cialis did help  Musculoskeletal:  Positive for arthralgias and myalgias. Negative for joint swelling.  Skin:  Negative for rash.  Allergic/Immunologic: Negative for environmental allergies and immunocompromised state.  Neurological:  Negative for dizziness, syncope, light-headedness and headaches.  Hematological:  Negative for adenopathy. Bruises/bleeds easily.  Psychiatric/Behavioral:  Negative for dysphoric mood and sleep disturbance. The patient is not nervous/anxious.        Objective:   Physical Exam Constitutional:      Appearance: Normal appearance.  HENT:     Mouth/Throat:     Pharynx: No oropharyngeal exudate or posterior oropharyngeal erythema.  Eyes:     Conjunctiva/sclera: Conjunctivae normal.     Pupils: Pupils are equal, round, and reactive to light.  Cardiovascular:     Rate and Rhythm: Normal rate and regular rhythm.     Pulses: Normal pulses.     Heart sounds: No murmur heard.    No gallop.  Pulmonary:     Effort: Pulmonary effort is normal.     Breath sounds: Normal breath sounds. No wheezing or rales.  Abdominal:     Palpations: Abdomen is soft.     Tenderness: There is no abdominal tenderness.  Musculoskeletal:     Cervical back: Neck supple.     Right lower leg: No edema.     Left lower leg: No edema.  Lymphadenopathy:     Cervical: No cervical adenopathy.  Skin:    Findings: No lesion or rash.  Neurological:     General: No focal deficit present.     Mental Status: He is alert and oriented to person, place, and time.  Psychiatric:        Mood and Affect: Mood normal.        Behavior: Behavior normal.            Assessment & Plan:

## 2022-10-05 NOTE — Assessment & Plan Note (Signed)
On imaging Will try low dose statin again--3 days per week

## 2022-10-05 NOTE — Assessment & Plan Note (Signed)
Still on metformin 500 daily

## 2022-10-05 NOTE — Assessment & Plan Note (Signed)
BP Readings from Last 3 Encounters:  10/05/22 138/84  03/11/22 122/86  01/11/22 124/80   Controlled with lisinopril hydrochlorothiazide 20/12.5

## 2022-10-06 ENCOUNTER — Other Ambulatory Visit: Payer: Self-pay | Admitting: Internal Medicine

## 2022-10-06 DIAGNOSIS — E875 Hyperkalemia: Secondary | ICD-10-CM

## 2022-10-19 ENCOUNTER — Other Ambulatory Visit (INDEPENDENT_AMBULATORY_CARE_PROVIDER_SITE_OTHER): Payer: Federal, State, Local not specified - PPO

## 2022-10-19 DIAGNOSIS — E875 Hyperkalemia: Secondary | ICD-10-CM

## 2022-10-19 LAB — POTASSIUM: Potassium: 4.5 mEq/L (ref 3.5–5.1)

## 2022-10-27 ENCOUNTER — Telehealth: Payer: Self-pay | Admitting: Internal Medicine

## 2022-10-27 NOTE — Telephone Encounter (Signed)
Patient called in and stated that his physical on 10-05-2024wasn't coded correct because his insurance isn't covering his physical. He was wondering if this could be looked at and corrected. Thank you!

## 2023-01-05 ENCOUNTER — Encounter: Payer: Self-pay | Admitting: Internal Medicine

## 2023-01-05 ENCOUNTER — Ambulatory Visit: Payer: Federal, State, Local not specified - PPO | Admitting: Internal Medicine

## 2023-01-05 VITALS — BP 132/84 | HR 60 | Temp 98.6°F | Ht 69.5 in | Wt 214.0 lb

## 2023-01-05 DIAGNOSIS — M1380 Other specified arthritis, unspecified site: Secondary | ICD-10-CM | POA: Insufficient documentation

## 2023-01-05 LAB — URINALYSIS
Bilirubin Urine: NEGATIVE
Hgb urine dipstick: NEGATIVE
Ketones, ur: NEGATIVE
Leukocytes,Ua: NEGATIVE
Nitrite: NEGATIVE
Specific Gravity, Urine: 1.025 (ref 1.000–1.030)
Total Protein, Urine: NEGATIVE
Urine Glucose: NEGATIVE
Urobilinogen, UA: 0.2 (ref 0.0–1.0)
pH: 6 (ref 5.0–8.0)

## 2023-01-05 LAB — COMPREHENSIVE METABOLIC PANEL
ALT: 21 U/L (ref 0–53)
AST: 15 U/L (ref 0–37)
Albumin: 4 g/dL (ref 3.5–5.2)
Alkaline Phosphatase: 59 U/L (ref 39–117)
BUN: 22 mg/dL (ref 6–23)
CO2: 29 meq/L (ref 19–32)
Calcium: 9.8 mg/dL (ref 8.4–10.5)
Chloride: 101 meq/L (ref 96–112)
Creatinine, Ser: 1.15 mg/dL (ref 0.40–1.50)
GFR: 66.92 mL/min (ref >60.00)
Glucose, Bld: 129 mg/dL — ABNORMAL HIGH (ref 70–99)
Potassium: 5.2 meq/L — ABNORMAL HIGH (ref 3.5–5.1)
Sodium: 138 meq/L (ref 135–145)
Total Bilirubin: 0.3 mg/dL (ref 0.2–1.2)
Total Protein: 7.2 g/dL (ref 6.0–8.3)

## 2023-01-05 LAB — CBC
HCT: 38.5 % — ABNORMAL LOW (ref 39.0–52.0)
Hemoglobin: 13 g/dL (ref 13.0–17.0)
MCHC: 33.7 g/dL (ref 30.0–36.0)
MCV: 96.6 fL (ref 78.0–100.0)
Platelets: 406 10*3/uL — ABNORMAL HIGH (ref 150.0–400.0)
RBC: 3.99 Mil/uL — ABNORMAL LOW (ref 4.22–5.81)
RDW: 12.5 % (ref 11.5–15.5)
WBC: 7.3 10*3/uL (ref 4.0–10.5)

## 2023-01-05 LAB — SEDIMENTATION RATE: Sed Rate: 42 mm/h — ABNORMAL HIGH (ref 0–20)

## 2023-01-05 NOTE — Progress Notes (Signed)
Subjective:    Patient ID: Gabriel Sullivan, male    DOB: 1955/06/11, 67 y.o.   MRN: [id number]  HPI Here due to hand swelling  5 nights ago---noted he couldn't pick up a drink due to pain in right wrist  Noted a knot across the dorsum of wrist The knot increased in size then Redness over radial size  Has swelling that moves around his body Knees, hip, noted in "ribs" also Then low back All before moving to wrist  No known bites  Ibuprofen helps Heat/ice also helps  Started years ago--with hip problems Hip is better now though--though told arthritis  Current Outpatient Medications on File Prior to Visit  Medication Sig Dispense Refill   acetaminophen (TYLENOL) 650 MG CR tablet Take 650 mg by mouth every 8 (eight) hours as needed for pain.     ibuprofen (ADVIL) 200 MG tablet Take 200 mg by mouth every 6 (six) hours as needed.     lisinopril-hydrochlorothiazide (ZESTORETIC) 20-12.5 MG tablet TAKE 1 TABLET BY MOUTH EVERY DAY 90 tablet 3   Loratadine (CLARITIN PO) Take 1 tablet by mouth daily.     MAGNESIUM GLYCINATE PO Take by mouth.     metFORMIN (GLUCOPHAGE) 500 MG tablet TAKE 1 TABLET BY MOUTH EVERY DAY WITH BREAKFAST 90 tablet 3   Multiple Vitamin (MULTIVITAMIN PO) Take 1 tablet by mouth daily.     Omega-3 Fatty Acids (FISH OIL PO) Take 1 capsule by mouth daily.     omeprazole (PRILOSEC) 20 MG capsule Take 20 mg by mouth daily.     rosuvastatin (CRESTOR) 5 MG tablet Take 1 tablet (5 mg total) by mouth 3 (three) times a week. 39 tablet 3   tadalafil (CIALIS) 20 MG tablet Take 0.5-1 tablets (10-20 mg total) by mouth every other day as needed for erectile dysfunction. 10 tablet 11   TURMERIC PO Take by mouth.     No current facility-administered medications on file prior to visit.    No Known Allergies  Past Medical History:  Diagnosis Date   Anxiety    BPH (benign prostatic hypertrophy)    GERD (gastroesophageal reflux disease)    Hypertension     Past Surgical  History:  Procedure Laterality Date   COLONOSCOPY  03/01/2008   "tortuous" , TIC's   CORNEAL TRANSPLANT  2016   DOBUTAMINE STRESS ECHO  2009   negative    Family History  Problem Relation Age of Onset   Arthritis Sister    Stroke Mother    COPD Brother    Diabetes Neg Hx    Hypertension Neg Hx    Heart disease Neg Hx    Cancer Neg Hx    Colon cancer Neg Hx    Colon polyps Neg Hx    Esophageal cancer Neg Hx    Stomach cancer Neg Hx    Rectal cancer Neg Hx     Social History   Socioeconomic History   Marital status: Married    Spouse name: Not on file   Number of children: 4   Years of education: Not on file   Highest education level: Some college, no degree  Occupational History   Occupation: Airline pilot for doors and hardware    Comment: back on his own  Tobacco Use   Smoking status: Former    Current packs/day: 0.00    Types: Cigarettes    Quit date: 03/12/1986    Years since quitting: 36.8    Passive exposure: Past  Smokeless tobacco: Former    Types: Chew    Quit date: 03/01/2011  Vaping Use   Vaping status: Never Used  Substance and Sexual Activity   Alcohol use: Yes    Comment: occ   Drug use: No   Sexual activity: Not on file  Other Topics Concern   Not on file  Social History Narrative   Divorced twice   Remarried 5/21   Social Determinants of Health   Financial Resource Strain: Low Risk  (01/03/2023)   Overall Financial Resource Strain (CARDIA)    Difficulty of Paying Living Expenses: Not hard at all  Food Insecurity: No Food Insecurity (01/03/2023)   Hunger Vital Sign    Worried About Running Out of Food in the Last Year: Never true    Ran Out of Food in the Last Year: Never true  Transportation Needs: No Transportation Needs (01/03/2023)   PRAPARE - Administrator, Civil Service (Medical): No    Lack of Transportation (Non-Medical): No  Physical Activity: Unknown (01/03/2023)   Exercise Vital Sign    Days of Exercise per Week: Patient  declined    Minutes of Exercise per Session: Not on file  Stress: No Stress Concern Present (01/03/2023)   [employer] of Occupational Health - Occupational Stress Questionnaire    Feeling of Stress : Not at all  Social Connections: Unknown (01/03/2023)   Social Connection and Isolation Panel [NHANES]    Frequency of Communication with Friends and Family: More than three times a week    Frequency of Social Gatherings with Friends and Family: Once a week    Attends Religious Services: More than 4 times per year    Active Member of [employer] or Organizations: Patient declined    Attends Banker Meetings: Not on file    Marital Status: Married  Intimate Partner Violence: Not on file   Review of Systems No fever BP did drop some---his cuff said 79/64. Felt cold. Improved with moving around and drinking juice No rash----or sun sensitivity No dypshagia    Objective:   Physical Exam Constitutional:      Appearance: Normal appearance.  Cardiovascular:     Rate and Rhythm: Normal rate and regular rhythm.     Heart sounds: No murmur heard.    No gallop.  Pulmonary:     Effort: Pulmonary effort is normal.     Breath sounds: Normal breath sounds. No wheezing or rales.  Musculoskeletal:     Cervical back: Neck supple.     Comments: Joints quiet now Right wrist picture looked like true synovitis  Lymphadenopathy:     Cervical: No cervical adenopathy.  Neurological:     Mental Status: He is alert.            Assessment & Plan:

## 2023-01-05 NOTE — Addendum Note (Signed)
Addended by: Alvina Chou on: 01/05/2023 11:13 AM   Modules accepted: Orders

## 2023-01-05 NOTE — Assessment & Plan Note (Addendum)
Nothing else to suggest rheumatologic disease No clear inciting infections Will check labs--CBC, chemistry, sed rate, ASA, RF, anti--CCP as well as viral screen----hep B and C and parvo Can continue ibuprofen prn Consider rheumatology evaluation depending on the resuts

## 2023-01-06 LAB — HEPATITIS C ANTIBODY: Hepatitis C Ab: NONREACTIVE

## 2023-01-07 LAB — CYCLIC CITRUL PEPTIDE ANTIBODY, IGG/IGA: Cyclic Citrullin Peptide Ab: 19 U (ref 0–19)

## 2023-01-07 LAB — ANA W/REFLEX: Anti Nuclear Antibody (ANA): NEGATIVE

## 2023-01-09 LAB — PARVOVIRUS B19 ANTIBODY, IGG AND IGM
Parvovirus B19 IgG: 4.3 — ABNORMAL HIGH (ref <0.9)
Parvovirus B19 IgM: 0.1 (ref <0.9)

## 2023-01-09 LAB — HEPATITIS B SURFACE ANTIGEN: Hepatitis B Surface Ag: NONREACTIVE

## 2023-01-09 LAB — RHEUMATOID FACTOR: Rheumatoid fact SerPl-aCnc: <10 [IU]/mL (ref <14)

## 2023-02-16 ENCOUNTER — Other Ambulatory Visit: Payer: Self-pay | Admitting: Internal Medicine

## 2023-05-11 DIAGNOSIS — E119 Type 2 diabetes mellitus without complications: Secondary | ICD-10-CM | POA: Diagnosis not present

## 2023-09-20 ENCOUNTER — Ambulatory Visit (INDEPENDENT_AMBULATORY_CARE_PROVIDER_SITE_OTHER): Admitting: Internal Medicine

## 2023-09-20 ENCOUNTER — Encounter: Payer: Self-pay | Admitting: Internal Medicine

## 2023-09-20 VITALS — BP 132/82 | HR 57 | Temp 97.9°F | Ht 69.5 in | Wt 207.0 lb

## 2023-09-20 DIAGNOSIS — Z125 Encounter for screening for malignant neoplasm of prostate: Secondary | ICD-10-CM

## 2023-09-20 DIAGNOSIS — Z0000 Encounter for general adult medical examination without abnormal findings: Secondary | ICD-10-CM

## 2023-09-20 DIAGNOSIS — K219 Gastro-esophageal reflux disease without esophagitis: Secondary | ICD-10-CM | POA: Diagnosis not present

## 2023-09-20 DIAGNOSIS — R7303 Prediabetes: Secondary | ICD-10-CM | POA: Diagnosis not present

## 2023-09-20 DIAGNOSIS — I10 Essential (primary) hypertension: Secondary | ICD-10-CM | POA: Diagnosis not present

## 2023-09-20 DIAGNOSIS — I700 Atherosclerosis of aorta: Secondary | ICD-10-CM | POA: Diagnosis not present

## 2023-09-20 DIAGNOSIS — R0609 Other forms of dyspnea: Secondary | ICD-10-CM

## 2023-09-20 LAB — COMPREHENSIVE METABOLIC PANEL WITH GFR
ALT: 13 U/L (ref 0–53)
AST: 14 U/L (ref 0–37)
Albumin: 4.3 g/dL (ref 3.5–5.2)
Alkaline Phosphatase: 53 U/L (ref 39–117)
BUN: 26 mg/dL — ABNORMAL HIGH (ref 6–23)
CO2: 28 meq/L (ref 19–32)
Calcium: 9.6 mg/dL (ref 8.4–10.5)
Chloride: 103 meq/L (ref 96–112)
Creatinine, Ser: 1.24 mg/dL (ref 0.40–1.50)
GFR: 60.83 mL/min (ref >60.00)
Glucose, Bld: 116 mg/dL — ABNORMAL HIGH (ref 70–99)
Potassium: 5.4 meq/L — ABNORMAL HIGH (ref 3.5–5.1)
Sodium: 140 meq/L (ref 135–145)
Total Bilirubin: 0.4 mg/dL (ref 0.2–1.2)
Total Protein: 7.3 g/dL (ref 6.0–8.3)

## 2023-09-20 LAB — LIPID PANEL
Cholesterol: 186 mg/dL (ref 0–200)
HDL: 48.3 mg/dL (ref >39.00)
LDL Cholesterol: 112 mg/dL — ABNORMAL HIGH (ref 0–99)
NonHDL: 138.19
Total CHOL/HDL Ratio: 4
Triglycerides: 130 mg/dL (ref 0.0–149.0)
VLDL: 26 mg/dL (ref 0.0–40.0)

## 2023-09-20 LAB — HEMOGLOBIN A1C: Hgb A1c MFr Bld: 6.2 % (ref 4.6–6.5)

## 2023-09-20 LAB — CBC
HCT: 41.9 % (ref 39.0–52.0)
Hemoglobin: 13.6 g/dL (ref 13.0–17.0)
MCHC: 32.5 g/dL (ref 30.0–36.0)
MCV: 96.8 fl (ref 78.0–100.0)
Platelets: 303 K/uL (ref 150.0–400.0)
RBC: 4.32 Mil/uL (ref 4.22–5.81)
RDW: 13.3 % (ref 11.5–15.5)
WBC: 7.2 K/uL (ref 4.0–10.5)

## 2023-09-20 LAB — TSH: TSH: 2.14 u[IU]/mL (ref 0.35–5.50)

## 2023-09-20 LAB — PSA, MEDICARE: PSA: 0.73 ng/mL (ref 0.10–4.00)

## 2023-09-20 NOTE — Assessment & Plan Note (Signed)
 On imaging Has not tolerated any statins

## 2023-09-20 NOTE — Assessment & Plan Note (Signed)
 Hopefully still okay with metformin  500 daily

## 2023-09-20 NOTE — Progress Notes (Signed)
 Subjective:    Patient ID: Gabriel Sullivan, male    DOB: 1955-06-08, 68 y.o.   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     ibuprofen  (ADVIL ) 200 MG tablet Take 200 mg by mouth every 6 (six) hours as needed.     lisinopril -hydrochlorothiazide  (ZESTORETIC ) 20-12.5 MG tablet TAKE 1 TABLET BY MOUTH EVERY DAY 90 tablet 3   Loratadine (CLARITIN PO) Take 1 tablet by mouth daily.     MAGNESIUM GLYCINATE PO Take by mouth.     metFORMIN  (GLUCOPHAGE ) 500 MG tablet TAKE 1 TABLET BY MOUTH EVERY DAY WITH BREAKFAST 90 tablet 3   Multiple Vitamin (MULTIVITAMIN PO) Take 1 tablet by mouth daily.     Omega-3 Fatty Acids (FISH OIL PO) Take 1 capsule by mouth daily.     omeprazole (PRILOSEC) 20 MG capsule Take 20 mg by mouth daily.     tadalafil  (CIALIS ) 20 MG tablet Take 0.5-1 tablets (10-20 mg total) by mouth every other day as needed for erectile dysfunction. 10 tablet 11   TURMERIC PO Take by mouth.     No current facility-administered medications on file prior to visit.    No Known Allergies  Past Medical History:  Diagnosis Date   Anxiety    BPH (benign prostatic hypertrophy)    Cataract 2015   Have implants   GERD (gastroesophageal reflux disease)    Hypertension      Past Surgical History:  Procedure Laterality Date   COLONOSCOPY  03/01/2008   tortuous , TIC's   CORNEAL TRANSPLANT  2016   DOBUTAMINE  STRESS ECHO  2009   negative   EYE SURGERY  2015   Both eye implants    Family History  Problem Relation Age of Onset   Arthritis Sister    Stroke Mother    COPD Brother    Diabetes Neg Hx    Hypertension Neg Hx    Heart disease Neg Hx    Cancer Neg Hx    Colon cancer Neg Hx    Colon polyps Neg Hx    Esophageal cancer Neg Hx    Stomach cancer Neg Hx    Rectal cancer Neg Hx     Social History   Socioeconomic History   Marital status: Married    Spouse name: Not on file   Number of children: 4   Years of education: Not on file   Highest education level: Associate degree: occupational, Scientist, product/process development, or vocational program  Occupational History   Occupation: Airline pilot for doors and hardware    Comment: back on his own  Tobacco Use   Smoking status: Former    Current packs/day: 0.00    Types: Cigarettes    Quit date: 03/12/1986    Years since quitting: 37.5    Passive  exposure: Past   Smokeless tobacco: Former    Types: Chew    Quit date: 03/01/2011  Vaping Use   Vaping status: Never Used  Substance and Sexual Activity   Alcohol use: Yes    Alcohol/week: 1.0 standard drink of alcohol    Types: 1 Standard drinks or equivalent per week    Comment: occ   Drug use: No   Sexual activity: Yes    Birth control/protection: None  Other Topics Concern   Not on file  Social History Narrative   Divorced twice   Remarried 5/21      No living will   Wife should be health care POA--children would be alternates   Would accept resuscitation   Tube feeds depending on prognosis   Social Drivers of Health   Financial Resource Strain: Low Risk  (09/20/2023)   Overall Financial Resource Strain (CARDIA)    Difficulty of Paying Living Expenses: Not very hard  Food Insecurity: No Food Insecurity (09/20/2023)   Hunger Vital Sign    Worried  About Running Out of Food in the Last Year: Never true    Ran Out of Food in the Last Year: Never true  Transportation Needs: No Transportation Needs (09/20/2023)   PRAPARE - Administrator, Civil Service (Medical): No    Lack of Transportation (Non-Medical): No  Physical Activity: Insufficiently Active (09/20/2023)   Exercise Vital Sign    Days of Exercise per Week: 1 day    Minutes of Exercise per Session: 20 min  Stress: No Stress Concern Present (09/20/2023)   [employer] of Occupational Health - Occupational Stress Questionnaire    Feeling of Stress: Not at all  Social Connections: Socially Integrated (09/20/2023)   Social Connection and Isolation Panel    Frequency of Communication with Friends and Family: More than three times a week    Frequency of Social Gatherings with Friends and Family: Twice a week    Attends Religious Services: More than 4 times per year    Active Member of [employer] or Organizations: Yes    Attends Engineer, structural: More than 4 times per year    Marital Status: Married  Catering manager Violence: Not At Risk (09/20/2023)   Humiliation, Afraid, Rape, and Kick questionnaire    Fear of Current or Ex-Partner: No    Emotionally Abused: No    Physically Abused: No    Sexually Abused: No   Review of Systems Weight is about the same Sleeps okay Wears seat belt Teeth are fine--keeps up with dentist Bowels move fine Voids fine--good stream without dribbling No suspicious skin lesions     Objective:   Physical Exam Constitutional:      Appearance: Normal appearance.  HENT:     Mouth/Throat:     Pharynx: No oropharyngeal exudate or posterior oropharyngeal erythema.  Eyes:     Conjunctiva/sclera: Conjunctivae normal.     Pupils: Pupils are equal, round, and reactive to light.  Cardiovascular:     Rate and Rhythm: Normal rate and regular rhythm.     Pulses: Normal pulses.     Heart sounds: No murmur heard.    No gallop.   Pulmonary:     Effort: Pulmonary effort is normal.     Breath sounds: Normal breath sounds. No wheezing or rales.  Abdominal:     Palpations: Abdomen is soft.     Tenderness: There is no abdominal tenderness.  Musculoskeletal:     Cervical back: Neck supple.  Right lower leg: No edema.     Left lower leg: No edema.  Lymphadenopathy:     Cervical: No cervical adenopathy.  Skin:    Findings: No lesion or rash.  Neurological:     General: No focal deficit present.     Mental Status: He is alert and oriented to person, place, and time.  Psychiatric:        Mood and Affect: Mood normal.        Behavior: Behavior normal.            Assessment & Plan:    Subjective:    DEMETRIUS MAHLER is a 68 y.o. male who presents for a Welcome to Medicare exam.   Cardiac Risk Factors include: advanced age (>68men, >31 women);obesity (BMI >30kg/m2)     Objective:    Today's Vitals   09/20/23 0923 09/20/23 0924  BP:  132/82  Pulse:  (!) 57  Temp:  97.9 F (36.6 C)  TempSrc:  Oral  SpO2:  97%  Weight: 207 lb (93.9 kg) 207 lb (93.9 kg)  Height: 5' 9.5 (1.765 m) 5' 9.5 (1.765 m)  PainSc:  6    Body mass index is 30.13 kg/m.  Medications Outpatient Encounter Medications as of 09/20/2023  Medication Sig   acetaminophen  (TYLENOL ) 650 MG CR tablet Take 650 mg by mouth every 8 (eight) hours as needed for pain.   ibuprofen  (ADVIL ) 200 MG tablet Take 200 mg by mouth every 6 (six) hours as needed.   lisinopril -hydrochlorothiazide  (ZESTORETIC ) 20-12.5 MG tablet TAKE 1 TABLET BY MOUTH EVERY DAY   Loratadine (CLARITIN PO) Take 1 tablet by mouth daily.   MAGNESIUM GLYCINATE PO Take by mouth.   metFORMIN  (GLUCOPHAGE ) 500 MG tablet TAKE 1 TABLET BY MOUTH EVERY DAY WITH BREAKFAST   Multiple Vitamin (MULTIVITAMIN PO) Take 1 tablet by mouth daily.   Omega-3 Fatty Acids (FISH OIL PO) Take 1 capsule by mouth daily.   omeprazole (PRILOSEC) 20 MG capsule Take 20 mg by mouth daily.   rosuvastatin   (CRESTOR ) 5 MG tablet Take 1 tablet (5 mg total) by mouth 3 (three) times a week.   tadalafil  (CIALIS ) 20 MG tablet Take 0.5-1 tablets (10-20 mg total) by mouth every other day as needed for erectile dysfunction.   TURMERIC PO Take by mouth.   No facility-administered encounter medications on file as of 09/20/2023.     History: Past Medical History:  Diagnosis Date   Anxiety    BPH (benign prostatic hypertrophy)    Cataract 2015   Have implants   GERD (gastroesophageal reflux disease)    Hypertension    Past Surgical History:  Procedure Laterality Date   COLONOSCOPY  03/01/2008   tortuous , TIC's   CORNEAL TRANSPLANT  2016   DOBUTAMINE  STRESS ECHO  2009   negative   EYE SURGERY  2015   Both eye implants    Family History  Problem Relation Age of Onset   Arthritis Sister    Stroke Mother    COPD Brother    Diabetes Neg Hx    Hypertension Neg Hx    Heart disease Neg Hx    Cancer Neg Hx    Colon cancer Neg Hx    Colon polyps Neg Hx    Esophageal cancer Neg Hx    Stomach cancer Neg Hx    Rectal cancer Neg Hx    Social History   Occupational History   Occupation: Airline pilot for doors and hardware  Comment: back on his own  Tobacco Use   Smoking status: Former    Current packs/day: 0.00    Types: Cigarettes    Quit date: 03/12/1986    Years since quitting: 37.5    Passive exposure: Past   Smokeless tobacco: Former    Types: Chew    Quit date: 03/01/2011  Vaping Use   Vaping status: Never Used  Substance and Sexual Activity   Alcohol use: Yes    Alcohol/week: 1.0 standard drink of alcohol    Types: 1 Standard drinks or equivalent per week    Comment: occ   Drug use: No   Sexual activity: Yes    Birth control/protection: None    Tobacco Counseling Counseling given: Not Answered   Immunizations and Health Maintenance Immunization History  Administered Date(s) Administered   Influenza Split 12/01/2010   Influenza Whole 11/20/2007, 12/03/2008   Influenza,  Seasonal, Injecte, Preservative Fre 12/31/2011   Influenza,inj,Quad PF,6+ Mos 11/28/2012   PFIZER(Purple Top)SARS-COV-2 Vaccination 05/22/2019, 06/12/2019   Td 09/25/2004   Tdap 08/07/2014   Health Maintenance Due  Topic Date Due   Pneumococcal Vaccine: 50+ Years (1 of 1 - PCV) Never done   Zoster Vaccines- Shingrix (1 of 2) Never done   INFLUENZA VACCINE  08/26/2023    Activities of Daily Living    09/20/2023    9:27 AM 09/19/2023    8:51 PM  In your present state of health, do you have any difficulty performing the following activities:  Hearing? 1 1  Comment has hearing aids. wearing them today   Vision? 0 0  Difficulty concentrating or making decisions? 0 0  Walking or climbing stairs? 0 0  Dressing or bathing? 0 0  Doing errands, shopping? 0 0  Preparing Food and eating ? N N  Using the Toilet? N N  In the past six months, have you accidently leaked urine? N N  Do you have problems with loss of bowel control? N N  Managing your Medications? N N  Managing your Finances? N N  Housekeeping or managing your Housekeeping? N N    Physical Exam   Physical Exam Constitutional:      Appearance: Normal appearance.  HENT:     Mouth/Throat:     Pharynx: No oropharyngeal exudate or posterior oropharyngeal erythema.  Eyes:     Conjunctiva/sclera: Conjunctivae normal.     Pupils: Pupils are equal, round, and reactive to light.  Cardiovascular:     Rate and Rhythm: Normal rate and regular rhythm.     Pulses: Normal pulses.     Heart sounds: No murmur heard.    No gallop.  Pulmonary:     Effort: Pulmonary effort is normal.     Breath sounds: Normal breath sounds. No wheezing or rales.  Abdominal:     Palpations: Abdomen is soft.     Tenderness: There is no abdominal tenderness.  Musculoskeletal:     Cervical back: Neck supple.     Right lower leg: No edema.     Left lower leg: No edema.  Lymphadenopathy:     Cervical: No cervical adenopathy.  Skin:    Findings: No  lesion or rash.  Neurological:     General: No focal deficit present.     Mental Status: He is alert and oriented to person, place, and time.  Psychiatric:        Mood and Affect: Mood normal.        Behavior: Behavior normal.    (  optional), or other factors deemed appropriate based on the beneficiary's medical and social history and current clinical standards.   Advanced Directives: Does Patient Have a Medical Advance Directive?: No Would patient like information on creating a medical advance directive?: Yes (MAU/Ambulatory/Procedural Areas - Information given)   EKG:  Not done today. Has one on file from 03-13-19     Assessment:    This is a routine wellness  examination for this patient .   Vision/Hearing screen Hearing Screening   500Hz  1000Hz  2000Hz  4000Hz   Right ear 20 0 20 20  Left ear 0 20 20 0  Comments: Has hearing aids. Wearing them today  Vision Screening   Right eye Left eye Both eyes  Without correction 20/25 20/20 20/15   With correction        Goals       Increase physical activity (pt-stated)         Depression Screen    09/20/2023    9:33 AM 10/05/2022   11:28 AM 01/11/2022    2:40 PM 09/23/2021   10:08 AM  PHQ 2/9 Scores  PHQ - 2 Score 0 0 0 0  PHQ- 9 Score   0      Fall Risk    09/19/2023    8:51 PM  Fall Risk   Falls in the past year? 0  Number falls in past yr: 0  Injury with Fall? 0    Cognitive Function        09/20/2023    9:36 AM  6CIT Screen  What Year? 0 points  What month? 0 points  What time? 0 points  Count back from 20 0 points  Months in reverse 0 points  Repeat phrase 2 points  Total Score 2 points    Patient Care Team: Jimmy Charlie FERNS, MD as PCP - General     Plan:     I have personally reviewed and noted the following in the patient's chart:   Medical and social history Use of alcohol, tobacco or illicit drugs  Current medications and supplements including opioid prescriptions. Patient is not  currently taking opioid prescriptions. Functional ability and status Nutritional status Physical activity Advanced directives List of other physicians Hospitalizations, surgeries, and ER visits in previous 12 months Vitals Screenings to include cognitive, depression, and falls Referrals and appointments  In addition, I have reviewed and discussed with patient certain preventive protocols, quality metrics, and best practice recommendations. A written personalized care plan for preventive services as well as general preventive health recommendations were provided to patient.     Clotilda SHAUNNA Pander, CMA 09/20/2023

## 2023-09-20 NOTE — Progress Notes (Signed)
 Patient would like to transfer/establish care you with. Is that okay?

## 2023-09-20 NOTE — Assessment & Plan Note (Signed)
 He feels this is worsened Will set up with cardiology

## 2023-09-20 NOTE — Assessment & Plan Note (Signed)
 I have personally reviewed the Medicare Annual Wellness questionnaire and have noted 1. The patient's medical and social history 2. Their use of alcohol, tobacco or illicit drugs 3. Their current medications and supplements 4. The patient's functional ability including ADL's, fall risks, home safety risks and hearing or visual             impairment. 5. Diet and physical activities 6. Evidence for depression or mood disorders  The patients weight, height, BMI and visual acuity have been recorded in the chart I have made referrals, counseling and provided education to the patient based review of the above and I have provided the pt with a written personalized care plan for preventive services.  I have provided you with a copy of your personalized plan for preventive services. Please take the time to review along with your updated medication list.  Colon due 2027 Will check PSA Trying to exercise Still prefers no immunizations

## 2023-09-20 NOTE — Assessment & Plan Note (Signed)
Controlled with omeprazole 

## 2023-09-20 NOTE — Assessment & Plan Note (Signed)
 BP Readings from Last 3 Encounters:  09/20/23 132/82  01/05/23 132/84  10/05/22 138/84   Controlled with lisinopril /hydrochlorothiazide  20/12.5 Will check labs

## 2023-09-21 ENCOUNTER — Ambulatory Visit: Payer: Self-pay | Admitting: Internal Medicine

## 2023-09-21 NOTE — Progress Notes (Signed)
 Patient understands that Dr. Mahlon is not accepting patients. He would like to establish with Dr. Jerrell. I am not sure what all needs to be done to transfer care. Please advise, I did not schedule patient.

## 2023-09-21 NOTE — Progress Notes (Signed)
 Pt has been scheduled on 9/5 for [facility] appt with Dr. Jerrell

## 2023-09-30 ENCOUNTER — Encounter: Payer: Self-pay | Admitting: Student in an Organized Health Care Education/Training Program

## 2023-09-30 ENCOUNTER — Ambulatory Visit: Admitting: Student in an Organized Health Care Education/Training Program

## 2023-09-30 VITALS — BP 138/84 | HR 73 | Ht 69.4 in | Wt 208.0 lb

## 2023-09-30 DIAGNOSIS — I10 Essential (primary) hypertension: Secondary | ICD-10-CM

## 2023-09-30 DIAGNOSIS — R7303 Prediabetes: Secondary | ICD-10-CM | POA: Diagnosis not present

## 2023-09-30 DIAGNOSIS — M791 Myalgia, unspecified site: Secondary | ICD-10-CM | POA: Diagnosis not present

## 2023-09-30 DIAGNOSIS — E875 Hyperkalemia: Secondary | ICD-10-CM

## 2023-09-30 DIAGNOSIS — R0609 Other forms of dyspnea: Secondary | ICD-10-CM | POA: Diagnosis not present

## 2023-09-30 NOTE — Assessment & Plan Note (Signed)
 Chronic muscle pain has been migrating throughout his body for two years, currently affecting the trapezius.  Previous testing showed a normal CK, normal thyroid function, normal rheumatoid factor and CCP, normal ANA, normal CBC.  Pain is alleviated by ibuprofen  and Tylenol  arthritis, but there are concerns about daily ibuprofen  use due to potential risks of stomach ulcers and kidney problems.  I interested in trying Cymbalta in the future.  I do not think there is a structural explanation for his migratory symptoms.  I wonder about a myofascial pain syndrome, but timing does not quite fit that.

## 2023-09-30 NOTE — Assessment & Plan Note (Signed)
 He has experienced shortness of breath for six months, worsening with exertion.  Currently can walk up 2 flights of stairs but then has to rest which is a big change for him.  There is no chest pain, but some pressure is noted. A heart murmur is present, and the differential includes cardiac and pulmonary causes. Order a resting echocardiogram to assess heart structure and function, and lung function testing to evaluate for emphysema, asthma, COPD, or other causes of dyspnea.

## 2023-09-30 NOTE — Assessment & Plan Note (Signed)
 Blood pressure is at 138/84 mmHg, well-controlled with Zestoretic . Monitoring is required due to potential impact on potassium levels.  Future use of lisinopril  may be limited because of hyperkalemia issues.

## 2023-09-30 NOTE — Progress Notes (Signed)
 Established Patient Office Visit  Subjective   Patient ID: RAPHEL STICKLES, male    DOB: 28-Mar-1955  Age: 68 y.o. MRN: [id number]  Chief Complaint  Patient presents with   Transitions Of Care    Current PCP is retiring   Past week having some issues with muscle pain that seems to move throughout his body. Last PCP did refer for Cardiology and patient has not been contacted     HPI  Discussed the use of AI scribe software for clinical note transcription with the patient, who gave verbal consent to proceed.  History of Present Illness JUSTN QUALE is a 68 year old male with prediabetes and hypertension who presents with muscle pain and shortness of breath.  He has been experiencing muscle pain that migrates throughout his body, with recent pain in the low back, rib cage, and currently in the trapezius area. This has been ongoing for about two years. Previous evaluations with blood work ruled out conditions such as fibromyalgia, polymyalgia, and multiple sclerosis. He has tried statins for cholesterol management but discontinued them due to joint pain. Currently, he takes ibuprofen  and Tylenol  arthritis twice daily.  He experiences shortness of breath, particularly when climbing stairs, which has been occurring for about six months. Carrying weight, such as a 40-pound toolbox, exacerbates the shortness of breath. No chest pain is present, but he mentions a little pressure and requires longer to recover after exertion. He has no history of lung problems, asthma, or recent tobacco use, having quit smoking in 1988.  His current medications include metformin  for prediabetes, Zestoretic  for hypertension, and over-the-counter omeprazole for reflux. He also takes vitamins and uses ibuprofen  and Tylenol  arthritis for pain management. He is concerned about the long-term use of ibuprofen  and its potential impact on his kidneys.  He has a history of arthritis in his hip, confirmed by x-ray, but it is not  currently an issue. He has been evaluated for rheumatoid arthritis, which was ruled out. He has no history of heart attacks or significant heart issues, although he had an echocardiogram in 2009 due to similar symptoms of shortness of breath and chest tightness.  He reports a high potassium level in the past, which led to dietary changes such as reducing banana intake. 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 No mood issues such as depression or anxiety.    Objective:     BP 138/84   Pulse 73   Ht 5' 9.4 (1.763 m)   Wt 208 lb (94.3 kg)   BMI 30.36 kg/m   Physical Exam  Gen: Well-appearing man Neck: Normal thyroid, no nodules or adenopathy Heart: Regular, 2 out of 6 early systolic murmur best heard at the right upper sternal border Lungs: Unlabored, very small amount of fine crackles in the left mid chest, but otherwise clear throughout Ext: Warm, no edema, normal joints, no tenderness to muscles on palpation    Assessment & Plan:    Problem List Items Addressed This Visit       High   Essential hypertension, benign (Chronic)   Blood pressure is at 138/84 mmHg, well-controlled with Zestoretic . Monitoring is required due to potential impact on potassium levels.  Future use of lisinopril  may be limited because of hyperkalemia issues.      DOE (dyspnea on exertion) - Primary (Chronic)   He has experienced shortness of breath for six months, worsening with exertion.  Currently can  walk up 2 flights of stairs but then has to rest which is a big change for him.  There is no chest pain, but some pressure is noted. A heart murmur is present, and the differential includes cardiac and pulmonary causes. Order a resting echocardiogram to assess heart structure and function, and lung function testing to evaluate for emphysema, asthma, COPD, or other causes of dyspnea.      Relevant Orders   ECHOCARDIOGRAM COMPLETE   Pulmonary  function test   Myalgia (Chronic)   Chronic muscle pain has been migrating throughout his body for two years, currently affecting the trapezius.  Previous testing showed a normal CK, normal thyroid function, normal rheumatoid factor and CCP, normal ANA, normal CBC.  Pain is alleviated by ibuprofen  and Tylenol  arthritis, but there are concerns about daily ibuprofen  use due to potential risks of stomach ulcers and kidney problems.  I interested in trying Cymbalta in the future.  I do not think there is a structural explanation for his migratory symptoms.  I wonder about a myofascial pain syndrome, but timing does not quite fit that.      Hyperkalemia (Chronic)   Previous high potassium levels are possibly related to the lisinopril  component of Zestoretic  and ibuprofen  use. He has reduced banana intake to manage potassium levels.  He has had normal CK levels, normal renal function.  He has a normal bicarb so I doubt a type IV RTA.  No hyponatremia or hypotension, so also doubt adrenal insufficiency.  Monitor potassium levels closely and consider adjusting blood pressure medication if levels remain high. Advised reducing ibuprofen  use to potentially lower potassium levels.        Medium    Prediabetes (Chronic)   His A1c is at 6.2%, indicating pre-diabetes, and is currently managed with metformin .       Return in about 6 weeks (around 11/11/2023).    Cleatus Debby Specking, MD

## 2023-09-30 NOTE — Assessment & Plan Note (Signed)
 His A1c is at 6.2%, indicating pre-diabetes, and is currently managed with metformin .

## 2023-09-30 NOTE — Assessment & Plan Note (Signed)
 Previous high potassium levels are possibly related to the lisinopril  component of Zestoretic  and ibuprofen  use. He has reduced banana intake to manage potassium levels.  He has had normal CK levels, normal renal function.  He has a normal bicarb so I doubt a type IV RTA.  No hyponatremia or hypotension, so also doubt adrenal insufficiency.  Monitor potassium levels closely and consider adjusting blood pressure medication if levels remain high. Advised reducing ibuprofen  use to potentially lower potassium levels.

## 2023-09-30 NOTE — Patient Instructions (Signed)
  VISIT SUMMARY: Today, we discussed your ongoing muscle pain, shortness of breath, and reviewed your current health conditions including prediabetes, hypertension, and gastroesophageal reflux disease. We also addressed your concerns about the long-term use of ibuprofen  and its potential impact on your kidneys.  YOUR PLAN: -CHRONIC MIGRATORY MYALGIA: Chronic migratory myalgia means you have muscle pain that moves to different parts of your body. To minimize the risks of stomach ulcers and kidney problems, you should use ibuprofen  only on bad days. We will review previous notes from Dr. Letvak for additional insights.  -EXERTIONAL DYSPNEA: Exertional dyspnea means you experience shortness of breath during physical activity. We will order a resting echocardiogram to check your heart and lung function tests to evaluate for any lung issues. We will wait for these test results before considering a referral to a cardiologist.  -PRE-DIABETES: Pre-diabetes means your blood sugar levels are higher than normal but not high enough to be classified as diabetes. Your condition is currently managed with metformin .  -ESSENTIAL HYPERTENSION: Essential hypertension means you have high blood pressure. Your blood pressure is well-controlled with Zestoretic , but we need to monitor it regularly due to its potential impact on potassium levels.  -GASTROESOPHAGEAL REFLUX DISEASE: Gastroesophageal reflux disease (GERD) means stomach acid frequently flows back into the tube connecting your mouth and stomach. Your condition is managed with over-the-counter omeprazole, and no new symptoms have been reported.  -HISTORY OF HYPERKALEMIA, MONITORING: Hyperkalemia means you have high potassium levels in your blood. This could be related to your blood pressure medication and ibuprofen  use. We will monitor your potassium levels closely and may adjust your medication if needed. Reducing ibuprofen  use may also help lower your potassium  levels.  INSTRUCTIONS: Please schedule a resting echocardiogram and lung function tests as soon as possible. Continue taking your current medications as prescribed, but use ibuprofen  only on bad days to minimize risks. Monitor your blood pressure and potassium levels regularly. Follow up with us  after completing the tests to discuss the results and next steps.

## 2023-10-06 ENCOUNTER — Encounter: Payer: Federal, State, Local not specified - PPO | Admitting: Internal Medicine

## 2023-10-10 NOTE — Telephone Encounter (Signed)
 Copied from CRM [id number]. Topic: Referral - Question >> Oct 10, 2023 11:05 AM Sophia H wrote: Reason for CRM: checking in on referral for Pulmonary function test, states he has not heard anything from them and wants to get this scheduled. Don't see anything in chart besides ov note? Please advise # [phone]

## 2023-10-13 ENCOUNTER — Encounter: Payer: Self-pay | Admitting: Student in an Organized Health Care Education/Training Program

## 2023-10-13 NOTE — Telephone Encounter (Signed)
 Called PFT scheduler and left vm to schedule patient.   Will send MyChart message letting patient know

## 2023-10-13 NOTE — Telephone Encounter (Signed)
 I see where the PFT was ordered on 09/30/23. But I do not see any updates. Please advise on where/who this should be sent to?

## 2023-10-18 NOTE — Telephone Encounter (Signed)
 I have called [facility] [phone], and left a message asking if they schedule OP PFT, and if so, to please call Gabriel Sullivan to schedule the appointment. I have also requested they return my call to ensure patient has been scheduled.

## 2023-10-24 ENCOUNTER — Ambulatory Visit (HOSPITAL_COMMUNITY)
Admission: RE | Admit: 2023-10-24 | Discharge: 2023-10-24 | Disposition: A | Discharge status: Home / Self Care | Source: Ambulatory Visit | Attending: Student in an Organized Health Care Education/Training Program | Admitting: Student in an Organized Health Care Education/Training Program

## 2023-10-24 DIAGNOSIS — R0609 Other forms of dyspnea: Secondary | ICD-10-CM | POA: Diagnosis not present

## 2023-10-24 LAB — PULMONARY FUNCTION TEST
DL/VA % pred: 78 %
DL/VA: 3.25 ml/min/mmHg/L
DLCO unc % pred: 86 %
DLCO unc: 22.88 ml/min/mmHg
FEF 25-75 Post: 4.53 L/s
FEF 25-75 Pre: 4.58 L/s
FEF2575-%Change-Post: -1 %
FEF2575-%Pred-Post: 169 %
FEF2575-%Pred-Pre: 171 %
FEV1-%Change-Post: 0 %
FEV1-%Pred-Post: 119 %
FEV1-%Pred-Pre: 120 %
FEV1-Post: 4.06 L
FEV1-Pre: 4.08 L
FEV1FVC-%Change-Post: 1 %
FEV1FVC-%Pred-Pre: 110 %
FEV6-%Change-Post: -1 %
FEV6-%Pred-Post: 111 %
FEV6-%Pred-Pre: 113 %
FEV6-Post: 4.83 L
FEV6-Pre: 4.93 L
FEV6FVC-%Change-Post: 0 %
FEV6FVC-%Pred-Post: 105 %
FEV6FVC-%Pred-Pre: 104 %
FVC-%Change-Post: -2 %
FVC-%Pred-Post: 105 %
FVC-%Pred-Pre: 108 %
FVC-Post: 4.83 L
FVC-Pre: 4.95 L
Post FEV1/FVC ratio: 84 %
Post FEV6/FVC ratio: 100 %
Pre FEV1/FVC ratio: 82 %
Pre FEV6/FVC Ratio: 99 %
RV % pred: 97 %
RV: 2.31 L
TLC % pred: 106 %
TLC: 7.46 L

## 2023-10-24 MED ORDER — ALBUTEROL SULFATE (2.5 MG/3ML) 0.083% IN NEBU
2.5000 mg | INHALATION_SOLUTION | Freq: Once | RESPIRATORY_TRACT | Status: AC
  Administered 2023-10-24: 2.5 mg via RESPIRATORY_TRACT

## 2023-10-25 ENCOUNTER — Other Ambulatory Visit (INDEPENDENT_AMBULATORY_CARE_PROVIDER_SITE_OTHER)

## 2023-10-25 ENCOUNTER — Ambulatory Visit: Payer: Self-pay | Admitting: Student in an Organized Health Care Education/Training Program

## 2023-10-25 DIAGNOSIS — R0609 Other forms of dyspnea: Secondary | ICD-10-CM

## 2023-10-25 LAB — ECHOCARDIOGRAM COMPLETE
Area-P 1/2: 3.31 cm2
S' Lateral: 2.63 cm

## 2023-10-25 NOTE — Progress Notes (Signed)
 LVM to call office.

## 2023-10-27 NOTE — Progress Notes (Signed)
 Lab results have been discussed.   Verbalized understanding? Yes  Are there any questions? No

## 2023-10-27 NOTE — Telephone Encounter (Signed)
 Patient reviewed echo results and has a question about what Left ventricular ejection fraction, by estimation, is 55 to 60% means?

## 2023-10-28 NOTE — Progress Notes (Signed)
 I spoke with the patient and answered his questions.  Dyspnea on exertion is about the same.  Pulmonary function testing and echocardiogram are reassuring.  I think this is most likely deconditioning and I encouraged him to increase cardio-focused exercise.  I think this will likely improve with more conditioning.

## 2023-11-08 ENCOUNTER — Encounter: Payer: Self-pay | Admitting: Student in an Organized Health Care Education/Training Program

## 2023-11-08 ENCOUNTER — Ambulatory Visit (INDEPENDENT_AMBULATORY_CARE_PROVIDER_SITE_OTHER): Admitting: Student in an Organized Health Care Education/Training Program

## 2023-11-08 VITALS — BP 116/62 | HR 62 | Ht 69.4 in | Wt 204.6 lb

## 2023-11-08 DIAGNOSIS — E875 Hyperkalemia: Secondary | ICD-10-CM

## 2023-11-08 DIAGNOSIS — E785 Hyperlipidemia, unspecified: Secondary | ICD-10-CM | POA: Insufficient documentation

## 2023-11-08 DIAGNOSIS — I10 Essential (primary) hypertension: Secondary | ICD-10-CM

## 2023-11-08 DIAGNOSIS — E7800 Pure hypercholesterolemia, unspecified: Secondary | ICD-10-CM

## 2023-11-08 DIAGNOSIS — R0609 Other forms of dyspnea: Secondary | ICD-10-CM

## 2023-11-08 MED ORDER — CHLORTHALIDONE 25 MG PO TABS: 25.0000 mg | ORAL_TABLET | Freq: Every day | ORAL | 3 refills | Status: AC

## 2023-11-08 NOTE — Assessment & Plan Note (Signed)
 Chronic and stable.  No signs of RTA or CKD.  I think this is likely due to recent ibuprofen  and lisinopril  use.  He has reduced his use of ibuprofen  dramatically.  At this point I do not think we need the lisinopril  for blood pressure control.  Will discontinue lisinopril .  Follow-up in 2 months for potassium recheck.

## 2023-11-08 NOTE — Assessment & Plan Note (Signed)
 Chronic and stable.  Our evaluation was reassuring with a normal echocardiogram, no structural heart disease.  Normal pulmonary function testing without lower obstruction or decreased DLCO.  We talked about conditioning, this is likely the cause of his dyspnea.  He is very functional, still working an active job.  Dyspnea is not functional limiting.  I do not think inhalers would be helpful at this point.  I encouraged continued lifestyle modification including weight loss and exercise.

## 2023-11-08 NOTE — Assessment & Plan Note (Signed)
 Chronic and stable.  Blood pressure is very well-controlled currently at 116/62.  Having a complication of hyperkalemia likely from lisinopril .  I am going to discontinue the lisinopril /HCTZ combination tablet.  Will switch to chlorthalidone 25 mg once daily.  I think this will be enough to manage his hypertension, goal blood pressure will be less than 130/85.  Follow-up with me in 2 months for blood pressure recheck.  Hopefully this will manage the hyperkalemia.  Can add amlodipine in the future if we need.

## 2023-11-08 NOTE — Progress Notes (Signed)
 Established Patient Office Visit  Subjective   Patient ID: Gabriel Sullivan, male    DOB: Nov 01, 1955  Age: 68 y.o. MRN: [id number]  Chief Complaint  Patient presents with   Follow-up    6wk follow up. No questions or concerns.     HPI  Discussed the use of AI scribe software for clinical note transcription with the patient, who gave verbal consent to proceed.  History of Present Illness FUAD FORGET is a 68 year old male who presents with breathing difficulties.  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  He has lost four pounds over the past six weeks, reducing his weight from 208 pounds to 204 pounds, attributing this to efforts to improve his breathing and overall health.  He has undergone an echocardiogram and lung function testing. He is concerned about potential heart issues but denies chest tightness, pressure, or pain. He occasionally experiences heart fluttering, which resolves with coughing.  He has a history of elevated cholesterol and has previously tried two different statins, both of which caused significant joint pain, leading to discontinuation. He is not currently taking any cholesterol-lowering medications and is cautious about introducing new medications.  He was previously taking two ibuprofen  in the morning and evening along with Tylenol  Arthritis but has since stopped these medications. His joint pain is manageable without them.  He experiences cramps during the day and night, which he manages with a foam spray for cramps and occasionally uses mustard and salt as remedies. He is concerned about the cramps occurring at night.  He has a history of elevated potassium levels, which has been an ongoing issue. He is currently on lisinopril , which may contribute to this elevation. His blood pressure medication regimen  includes Zestoretic , which he takes in the morning along with his vitamins.      Objective:     BP 116/62 (BP Location: Right Arm, Patient Position: Sitting, Cuff Size: Large)   Pulse 62   Ht 5' 9.4 (1.763 m)   Wt 204 lb 9.6 oz (92.8 kg)   SpO2 99%   BMI 29.87 kg/m   Physical Exam  Gen: Well-appearing man Neck: Normal thyroid , no nodules or adenopathy Heart: Regular, no murmur Lungs: Unlabored, clear throughout Ext: Warm, no edema     Assessment & Plan:   Problem List Items Addressed This Visit       High   Essential hypertension, benign - Primary (Chronic)   Chronic and stable.  Blood pressure is very well-controlled currently at 116/62.  Having a complication of hyperkalemia likely from lisinopril .  I am going to discontinue the lisinopril /HCTZ combination tablet.  Will switch to chlorthalidone 25 mg once daily.  I think this will be enough to manage his hypertension, goal blood pressure will be less than 130/85.  Follow-up with me in 2 months for blood pressure recheck.  Hopefully this will manage the hyperkalemia.  Can add amlodipine in the future if we need.      Relevant Medications   chlorthalidone (HYGROTON) 25 MG tablet   DOE (dyspnea on exertion) (Chronic)   Chronic and stable.  Our evaluation was reassuring with a normal echocardiogram, no structural heart disease.  Normal pulmonary function testing without lower obstruction or decreased DLCO.  We talked about conditioning, this is likely the cause of his dyspnea.  He is very functional, still working an active  job.  Dyspnea is not functional limiting.  I do not think inhalers would be helpful at this point.  I encouraged continued lifestyle modification including weight loss and exercise.      Hyperkalemia (Chronic)   Chronic and stable.  No signs of RTA or CKD.  I think this is likely due to recent ibuprofen  and lisinopril  use.  He has reduced his use of ibuprofen  dramatically.  At this point I do not think we  need the lisinopril  for blood pressure control.  Will discontinue lisinopril .  Follow-up in 2 months for potassium recheck.        Medium    Hyperlipidemia (Chronic)   The 10-year ASCVD risk score (Arnett DK, et al., 2019) is: 13.1%  Unable to tolerate 2 trials of statins due to diffuse myalgias.  LDL currently is above goal around 112.  We talked about ezetimibe as a potential way to lower the cholesterol and slightly lower his risk of heart attack or stroke.  Patient's declined, wants to continue working on lifestyle modifications.  We talked about diet.      Relevant Medications   chlorthalidone (HYGROTON) 25 MG tablet    Return in about 2 months (around 01/08/2024).    Cleatus Debby Specking, MD

## 2023-11-08 NOTE — Patient Instructions (Signed)
  VISIT SUMMARY: Today, we discussed your breathing difficulties, which have remained stable over the past six weeks. Your recent echocardiogram and lung function tests were normal, ruling out heart and lung issues. We also reviewed your blood pressure, potassium levels, muscle cramps, cholesterol, and joint pain.  YOUR PLAN: -DYSPNEA ON EXERTION: Your breathing difficulties during physical activity are stable, and your heart and lung tests are normal. Continue with regular exercise to improve your stamina and maintain your weight loss efforts.  -ESSENTIAL HYPERTENSION AND HYPERKALEMIA: Your blood pressure is well-controlled, but your potassium levels are high, likely due to your current medication. We are switching your medication from Zestoretic  to chlorthalidone to help manage your potassium levels. We will recheck your blood pressure and potassium levels in 2 months.  -MUSCLE CRAMPS: Your muscle cramps may be related to high potassium levels. The change in your medication to chlorthalidone should help with this. We will reassess the frequency and severity of your cramps in 2 months.  -HYPERLIPIDEMIA, STATIN INTOLERANCE: Your cholesterol is slightly elevated, and you cannot tolerate statins. We discussed Zetia, but you declined. Focus on dietary changes, including whole foods, fruits, vegetables, and lean meats. We will monitor your cholesterol levels.  -JOINT PAIN: Your joint pain in your knees, hands, shoulders, and hips is manageable without regular medication. Use ibuprofen  as needed for severe pain, but try to limit it to a few times a month.  INSTRUCTIONS: We will recheck your blood pressure and potassium levels in 2 months. Additionally, we will reassess the frequency and severity of your muscle cramps at that time. Continue with your dietary changes and regular exercise, and monitor your cholesterol levels.

## 2023-11-08 NOTE — Assessment & Plan Note (Signed)
 The 10-year ASCVD risk score (Arnett DK, et al., 2019) is: 13.1%  Unable to tolerate 2 trials of statins due to diffuse myalgias.  LDL currently is above goal around 112.  We talked about ezetimibe as a potential way to lower the cholesterol and slightly lower his risk of heart attack or stroke.  Patient's declined, wants to continue working on lifestyle modifications.  We talked about diet.

## 2024-01-02 ENCOUNTER — Ambulatory Visit: Payer: Self-pay

## 2024-01-02 NOTE — Telephone Encounter (Signed)
 FYI Only or Action Required?: FYI only for provider: appointment scheduled on 01/03/24.  Patient was last seen in primary care on 11/08/2023 by Jerrell Cleatus Ned, MD.  Called Nurse Triage reporting Cough.  Symptoms began several days ago.  Interventions attempted: OTC medications: corcidin, dayquil, nyquil.  Symptoms are: stable.  Triage Disposition: Home Care  Patient/caregiver understands and will follow disposition?: Yes Reason for Disposition  Cough with cold symptoms (e.g., runny nose, postnasal drip, throat clearing)  Answer Assessment - Initial Assessment Questions Home covid test negative. Patient reports cutting a few strawberries and rubbed eyes afterwards and both eyes got real red. Denies itchiness, pain or swelling. Denies issues with vision. Eyes are little watery at times. Patient has been taking liquid corcidin and dayquil/ nyquil. Patient wanting to schedule appointment with PCP  1. ONSET: When did the cough begin?      Thursday  2. SPUTUM: Describe the color of your sputum (e.g., none, dry cough; clear, white, yellow, green)     Green/ brown  3. DIFFICULTY BREATHING: Are you having difficulty breathing? If Yes, ask: How bad is it? (e.g., mild, moderate, severe)      Denies  4. FEVER: Do you have a fever? If Yes, ask: What is your temperature, how was it measured, and when did it start?     Denies  5. CARDIAC HISTORY: Do you have any history of heart disease? (e.g., heart attack, congestive heart failure)      Denies  6. LUNG HISTORY: Do you have any history of lung disease?  (e.g., pulmonary embolus, asthma, emphysema)     Denies  7. OTHER SYMPTOMS: Do you have any other symptoms? (e.g., runny nose, wheezing, chest pain)       Post nasal drip  Protocols used: Cough - Acute Productive-A-AH    Copied from CRM #[id number]. Topic: Clinical - Red Word Triage >> Jan 02, 2024  9:07 AM Berneda FALCON wrote: Red Word that prompted transfer to  Nurse Triage: Patient is requesting an appt for his allergies-states he has a cough the past couple days, lack of sleep, both eyes are red. Allergic to strawberries and did touch strawberries this weekend. Thinks it may be more than just allergies but he is not positive.  He is more concerned with the cough, but states he capped strawberries this weekend and then touched his eyes which he feels caused the discomfort/eye redness.

## 2024-01-02 NOTE — Telephone Encounter (Signed)
FYI patient has appt tomorrow.

## 2024-01-03 ENCOUNTER — Ambulatory Visit: Admitting: Student in an Organized Health Care Education/Training Program

## 2024-01-03 ENCOUNTER — Encounter: Payer: Self-pay | Admitting: Student in an Organized Health Care Education/Training Program

## 2024-01-03 VITALS — BP 140/81 | HR 77 | Wt 201.0 lb

## 2024-01-03 DIAGNOSIS — J069 Acute upper respiratory infection, unspecified: Secondary | ICD-10-CM | POA: Insufficient documentation

## 2024-01-03 MED ORDER — BENZONATATE 200 MG PO CAPS: 200.0000 mg | ORAL_CAPSULE | Freq: Two times a day (BID) | ORAL | 0 refills | Status: AC | PRN

## 2024-01-03 MED ORDER — GUAIFENESIN-CODEINE 100-10 MG/5ML PO SOLN: 5.0000 mL | Freq: Every evening | ORAL | 0 refills | Status: AC | PRN

## 2024-01-03 NOTE — Patient Instructions (Signed)
  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 The cough has been bothering you, especially at night, and has caused some abdominal soreness from the strain of coughing. You have been using over-the-counter medications to manage the symptoms.  YOUR PLAN: -ACUTE BRONCHITIS: Acute bronchitis is an inflammation of the bronchial tubes, usually caused by a viral infection, and can be worsened by irritants like dust. You have been prescribed a codeine -based cough syrup to use at night to help you sleep, but be cautious as it can cause drowsiness, so avoid driving and alcohol. For daytime cough management, you have been prescribed Tessalon . You can continue using chloraseptic spray as needed. Avoid using Nyquil with the codeine  syrup, but DayQuil is safe to use. This condition is self-limiting and should improve on its own.  INSTRUCTIONS: Please follow the medication instructions carefully and avoid driving or consuming alcohol while using the codeine -based cough syrup. If your symptoms do not improve or if they worsen, please schedule a follow-up appointment. You can proceed with your dental appointment once your cough is under control.

## 2024-01-03 NOTE — Assessment & Plan Note (Signed)
 Likely viral in origin. No signs of bacterial infection or pneumonia. This is a self-limiting condition.  The frequent nonproductive cough is the most bothersome symptom to him.  He does not have much systemic symptoms to suggest influenza.  Prescribed codeine -based cough syrup for nighttime use, with caution about drowsiness and advised against driving and alcohol. Tessalon  prescribed for daytime cough management. Advised against using Nyquil given his age; DayQuil is safe. Continue chloraseptic as needed. Reassured that the condition is likely self-limited, but to call me if he has worsening symptoms.

## 2024-01-03 NOTE — Progress Notes (Signed)
 Acute Office Visit  Patient ID: Gabriel Sullivan, male    DOB: 03/13/1955, 68 y.o.   MRN: [id number]  PCP: Jerrell Cleatus Ned, MD  Chief Complaint  Patient presents with   Eye Pain    Please read triage note from yesterday   Home covid test negative. Patient reports cutting a few strawberries and rubbed eyes afterwards and both eyes got real red. Denies itchiness, pain or swelling. Denies issues with vision. Eyes are little watery at times. Patient has been taking liquid corcidin and dayquil/ nyquil.    Subjective:     HPI  Discussed the use of AI scribe software for clinical note transcription with the patient, who gave verbal consent to proceed.  History of Present Illness Gabriel Sullivan is a 68 year old male who presents with a persistent cough following exposure to sheetrock dust.  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 The cough began on Thursday afternoon, approximately five days ago, and is described as persistent and mostly non-productive, with occasional episodes of bringing up small amounts of sputum. No hemoptysis is noted.  No fever, chills, ear pain, sinus pain, or sore throat. He reports no significant nasal discharge, although minor epistaxis occurs when blowing his nose forcefully. The cough is more bothersome at night, affecting his sleep, and he has spent several nights on the couch to alleviate symptoms. Abdominal soreness is noted due to the strain from coughing.  His past medical history includes a history of smoking, which he quit in 1988. He denies current tobacco use. He reports no gastrointestinal symptoms such as upset stomach, diarrhea, or vomiting, and maintains a normal appetite and hydration.  Current medications include over-the-counter Nyquil at night, DayQuil during the day, and chloraseptic spray, all in liquid form. He is seeking relief from the cough to proceed with a  dental appointment for a broken crown and a lost filling.      Objective:    BP (!) 140/81   Pulse 77   Wt 201 lb (91.2 kg)   SpO2 98%   BMI 29.34 kg/m   Physical Exam  Gen: Well-appearing older man Ears: Right tympanic membrane is moderately erythematous, no middle ear effusion, left tympanic membrane is normal Mouth: Mild erythema in the posterior oropharynx Neck: No tender cervical adenopathy Lungs: Unlabored, clear throughout with no crackles     Assessment & Plan:   Problem List Items Addressed This Visit       Unprioritized   URTI (acute upper respiratory infection) - Primary   Likely viral in origin. No signs of bacterial infection or pneumonia. This is a self-limiting condition.  The frequent nonproductive cough is the most bothersome symptom to him.  He does not have much systemic symptoms to suggest influenza.  Prescribed codeine -based cough syrup for nighttime use, with caution about drowsiness and advised against driving and alcohol. Tessalon  prescribed for daytime cough management. Advised against using Nyquil given his age; DayQuil is safe. Continue chloraseptic as needed. Reassured that the condition is likely self-limited, but to call me if he has worsening symptoms.      Relevant Medications   guaiFENesin -codeine  100-10 MG/5ML syrup   benzonatate  (TESSALON ) 200 MG capsule    Meds ordered this encounter  Medications   guaiFENesin -codeine  100-10 MG/5ML syrup    Sig: Take 5 mLs by mouth at bedtime as needed.    Dispense:  120 mL  Refill:  0   benzonatate  (TESSALON ) 200 MG capsule    Sig: Take 1 capsule (200 mg total) by mouth 2 (two) times daily as needed for cough.    Dispense:  20 capsule    Refill:  0    Return if symptoms worsen or fail to improve.  Cleatus Debby Specking, MD [facility]

## 2024-01-09 ENCOUNTER — Ambulatory Visit: Admitting: Student in an Organized Health Care Education/Training Program

## 2024-01-12 ENCOUNTER — Ambulatory Visit: Payer: Self-pay

## 2024-01-12 NOTE — Telephone Encounter (Signed)
 FYI Only or Action Required?: FYI only for provider: appointment scheduled on 01/13/24.  Patient was last seen in primary care on 01/03/2024 by Jerrell Cleatus Ned, MD.  Called Nurse Triage reporting URI.  Symptoms began several days ago.  Interventions attempted: Prescription medications: cough med.  Symptoms are: unchanged.  Triage Disposition: See Physician Within 24 Hours  Patient/caregiver understands and will follow disposition?: Yes     Reason for Triage: Patient was seen on 01/03/2024 for an  upper respiratory infection, patient states symptoms have not improved, congestion, cough and a lot of drainage.   Reason for Disposition  [1] Fever returns after gone for over 24 hours AND [2] symptoms worse or not improved  Answer Assessment - Initial Assessment Questions 1. ONSET: When did the cough begin?      10 days ago 2. SEVERITY: How bad is the cough today?      intermittent 3. SPUTUM: Describe the color of your sputum (e.g., none, dry cough; clear, white, yellow, green)     brown 4. HEMOPTYSIS: Are you coughing up any blood? If Yes, ask: How much? (e.g., flecks, streaks, tablespoons, etc.)     no 5. DIFFICULTY BREATHING: Are you having difficulty breathing? If Yes, ask: How bad is it? (e.g., mild, moderate, severe)      none 6. FEVER: Do you have a fever? If Yes, ask: What is your temperature, how was it measured, and when did it start?     Felt feverish yesterday 7. CARDIAC HISTORY: Do you have any history of heart disease? (e.g., heart attack, congestive heart failure)      High blood pressure 8. LUNG HISTORY: Do you have any history of lung disease?  (e.g., pulmonary embolus, asthma, emphysema) htn 9. PE RISK FACTORS: Do you have a history of blood clots? (or: recent major surgery, recent prolonged travel, bedridden)     no 10. OTHER SYMPTOMS: Do you have any other symptoms? (e.g., runny nose, wheezing, chest pain)       Nasal  drainage  Protocols used: Cough - Acute Non-Productive-A-AH

## 2024-01-13 ENCOUNTER — Ambulatory Visit: Admitting: Family Medicine

## 2024-01-13 ENCOUNTER — Encounter: Payer: Self-pay | Admitting: Family Medicine

## 2024-01-13 ENCOUNTER — Ambulatory Visit

## 2024-01-13 VITALS — BP 100/76 | HR 114 | Temp 99.3°F | Resp 16 | Ht 69.4 in | Wt 194.2 lb

## 2024-01-13 DIAGNOSIS — R059 Cough, unspecified: Secondary | ICD-10-CM | POA: Diagnosis not present

## 2024-01-13 DIAGNOSIS — J988 Other specified respiratory disorders: Secondary | ICD-10-CM

## 2024-01-13 MED ORDER — AZITHROMYCIN 250 MG PO TABS: ORAL_TABLET | ORAL | 0 refills | Status: AC

## 2024-01-13 NOTE — Patient Instructions (Addendum)
 A few things to remember from today's visit:  Respiratory tract infection - Plan: azithromycin (ZITHROMAX) 250 MG tablet, DG Chest 2 View  Cough, unspecified type - Plan: DG Chest 2 View  Plain Mucinex  may help. Monitor for fever. Continue Flonase nasal spray daily as needed.  If you need refills for medications you take chronically, please call your pharmacy. Do not use My Chart to request refills or for acute issues that need immediate attention. If you send a my chart message, it may take a few days to be addressed, specially if I am not in the office.  Please be sure medication list is accurate. If a new problem present, please set up appointment sooner than planned today.

## 2024-01-13 NOTE — Progress Notes (Signed)
 "  ACUTE VISIT Chief Complaint  Patient presents with   Cough    Pt c/o ongoing cough since 12/5. Had a visit on 12/9 with Dr. Jerrell. Pt reports thick mucus- clear and drainage. Denied fever, chills, shortness of breathe, chest pain. But feeling warm. Currently taking tessalon , cough with codein.     Discussed the use of AI scribe software for clinical note transcription with the patient, who gave verbal consent to proceed.  History of Present Illness Gabriel Sullivan is a 68 year old male with PMHx significant for GERD,HTN,and anxiety here today complaining of persistent cough as described above.  He has been experiencing a persistent cough since December 5th, accompanied by thick, clear mucus and both nasal and postnasal drainage.  No shortness of breath, chest pain, or wheezing.  He notes feeling warm but has not measured his temperature, today it was recorded as 99.36F today. He is concerned about his serious respiratory process. He denies any new symptoms, stating that his symptoms remain the same and are not improving. Evaluated for the symptoms on 01/03/2024.  Former smoker, he quit smoking in 1988. GERD: He has not noted acid reflux or heartburn. Currently he is on omeprazole 20 mg daily.  He has experienced nausea and vomiting, which he attributes to the mucus drainage, stating that he feels better after vomiting.  Negative for abdominal pain or changes in bowel habits. He has used Chloraseptic and DayQuil without significant relief.  He is currently taking a cough syrup with codeine ,prescribed by his pcp.  His wife has similar symptoms and was prescribed antibiotics by her pcp.   Review of Systems  Constitutional:  Negative for activity change, appetite change, chills and unexpected weight change.  HENT:  Negative for ear pain, facial swelling, mouth sores and sore throat.   Eyes:  Negative for discharge and redness.  Cardiovascular:  Negative for chest pain, palpitations and  leg swelling.  Musculoskeletal:  Negative for myalgias.  Neurological:  Negative for syncope, weakness and headaches.  See other pertinent positives and negatives in HPI.  Medications Ordered Prior to Encounter[1]  Past Medical History:  Diagnosis Date   Anxiety    BPH (benign prostatic hypertrophy)    Cataract 2015   Have implants   GERD (gastroesophageal reflux disease)    Hypertension    Allergies[2]  Social History   Socioeconomic History   Marital status: Married    Spouse name: Not on file   Number of children: 4   Years of education: Not on file   Highest education level: Some college, no degree  Occupational History   Occupation: Airline Pilot for [employer]    Comment: back on his own  Tobacco Use   Smoking status: Former    Current packs/day: 0.00    Types: Cigarettes    Quit date: 03/12/1986    Years since quitting: 37.8    Passive exposure: Past   Smokeless tobacco: Former    Types: Chew    Quit date: 03/01/2011  Vaping Use   Vaping status: Never Used  Substance and Sexual Activity   Alcohol use: Yes    Alcohol/week: 1.0 standard drink of alcohol    Types: 1 Standard drinks or equivalent per week    Comment: occ   Drug use: No   Sexual activity: Yes    Birth control/protection: None  Other Topics Concern   Not on file  Social History Narrative   Divorced twice   Remarried 5/21  No living will   Wife should be health care POA--children would be alternates   Would accept resuscitation   Tube feeds depending on prognosis   Social Drivers of Health   Tobacco Use: Medium Risk (01/13/2024)   Patient History    Smoking Tobacco Use: Former    Smokeless Tobacco Use: Former    Passive Exposure: Past  Physicist, Medical Strain: Low Risk (11/05/2023)   Overall Financial Resource Strain (CARDIA)    Difficulty of Paying Living Expenses: Not hard at all  Food Insecurity: No Food Insecurity (11/05/2023)   Epic    Worried About Programme Researcher, Broadcasting/film/video  in the Last Year: Never true    Ran Out of Food in the Last Year: Never true  Transportation Needs: No Transportation Needs (11/05/2023)   Epic    Lack of Transportation (Medical): No    Lack of Transportation (Non-Medical): No  Physical Activity: Insufficiently Active (11/05/2023)   Exercise Vital Sign    Days of Exercise per Week: 2 days    Minutes of Exercise per Session: 10 min  Stress: No Stress Concern Present (11/05/2023)   [employer] of Occupational Health - Occupational Stress Questionnaire    Feeling of Stress: Not at all  Social Connections: Socially Integrated (11/05/2023)   Social Connection and Isolation Panel    Frequency of Communication with Friends and Family: Three times a week    Frequency of Social Gatherings with Friends and Family: Twice a week    Attends Religious Services: More than 4 times per year    Active Member of Clubs or Organizations: Yes    Attends Banker Meetings: More than 4 times per year    Marital Status: Married  Depression (PHQ2-9): Low Risk (09/30/2023)   Depression (PHQ2-9)    PHQ-2 Score: 4  Alcohol Screen: Low Risk (11/05/2023)   Alcohol Screen    Last Alcohol Screening Score (AUDIT): 4  Housing: Unknown (11/05/2023)   Epic    Unable to Pay for Housing in the Last Year: No    Number of Times Moved in the Last Year: Not on file    Homeless in the Last Year: No  Utilities: Not At Risk (09/20/2023)   Epic    Threatened with loss of utilities: No  Health Literacy: Adequate Health Literacy (09/20/2023)   B1300 Health Literacy    Frequency of need for help with medical instructions: Never   Vitals:   01/13/24 1107  BP: 100/76  Pulse: (!) 114  Resp: 16  Temp: 99.3 F (37.4 C)  SpO2: 92%   Body mass index is 28.35 kg/m.  Physical Exam Vitals and nursing note reviewed.  Constitutional:      General: He is not in acute distress.    Appearance: He is well-developed. He is not ill-appearing.  HENT:     Head:  Normocephalic and atraumatic.     Ears:     Comments: Hearing aids in place.    Nose: Rhinorrhea present. No congestion.     Mouth/Throat:     Pharynx: Postnasal drip present. No oropharyngeal exudate or posterior oropharyngeal erythema.  Eyes:     Conjunctiva/sclera: Conjunctivae normal.  Cardiovascular:     Rate and Rhythm: Normal rate and regular rhythm.     Heart sounds: No murmur heard.    Comments: HR 100/min Pulmonary:     Effort: Pulmonary effort is normal. No respiratory distress.     Breath sounds: Normal breath sounds. No stridor.  Lymphadenopathy:  Cervical: No cervical adenopathy.  Skin:    General: Skin is warm.     Findings: No erythema or rash.  Neurological:     General: No focal deficit present.     Mental Status: He is alert and oriented to person, place, and time.     Gait: Gait normal.  Psychiatric:        Mood and Affect: Mood and affect normal.   ASSESSMENT AND PLAN:  Mr. Kalvin Buss was seen today for cough.  Diagnoses and all orders for this visit: Orders Placed This Encounter  Procedures   DG Chest 2 View   Respiratory tract infection Most likely viral, symptoms he is reported today could be caused by postnasal drainage. He states that he is already using Flonase nasal spray, continue at bedtime for 10 to 14 days. Explained that cough and congestion can last several days and even weeks after acute respiratory symptoms have resolved. Instructed to monitor temperature. I do not think antibiotic treatment needed at this time, explained that it will not help in case of viral illness. Instructed about warning signs. Recommend follow-up with PCP in a week if he is not feeling any better.  -     azithromycin (ZITHROMAX) 250 MG tablet; Take 2 tablets on day 1, then 1 tablet daily on days 2 through 5 -     DG Chest 2 View; Future  Cough, unspecified type Lung auscultation negative today. We discussed possible etiologies, postviral most likely but  GERD can be a contributing factor. Chest x-ray ordered given the fact that he has been coughing since 12/30/2023 and not feeling any better. Continue benzonatate  and guaifenesin -codeine  prn.  -     DG Chest 2 View; Future  Return in about 1 week (around 01/20/2024) for if not better with PCP.  Nellie Pester G. Karelly Dewalt, MD  [facility]. [place] office.     [1]  Current Outpatient Medications on File Prior to Visit  Medication Sig Dispense Refill   benzonatate  (TESSALON ) 200 MG capsule Take 1 capsule (200 mg total) by mouth 2 (two) times daily as needed for cough. 20 capsule 0   chlorthalidone  (HYGROTON ) 25 MG tablet Take 1 tablet (25 mg total) by mouth daily. 90 tablet 3   guaiFENesin -codeine  100-10 MG/5ML syrup Take 5 mLs by mouth at bedtime as needed. 120 mL 0   metFORMIN  (GLUCOPHAGE ) 500 MG tablet TAKE 1 TABLET BY MOUTH EVERY DAY WITH BREAKFAST 90 tablet 3   omeprazole (PRILOSEC) 20 MG capsule Take 20 mg by mouth daily.     tadalafil  (CIALIS ) 20 MG tablet Take 0.5-1 tablets (10-20 mg total) by mouth every other day as needed for erectile dysfunction. 10 tablet 11   No current facility-administered medications on file prior to visit.  [2] No Known Allergies  "

## 2024-01-31 ENCOUNTER — Ambulatory Visit: Payer: Self-pay | Admitting: Family Medicine

## 2024-02-15 ENCOUNTER — Other Ambulatory Visit: Payer: Self-pay

## 2024-02-15 NOTE — Progress Notes (Signed)
 Received a fax request from [facility] to refill Metformin  Rx this was prescribed by Dr Jimmy previously.
